# Patient Record
Sex: Female | Born: 1940 | Race: White | Hispanic: No | Smoking: Former smoker
Health system: Southern US, Community
[De-identification: ages and names within clinical notes are randomized; demographics above are authoritative.]

## PROBLEM LIST (undated history)

## (undated) DIAGNOSIS — Z8719 Personal history of other diseases of the digestive system: Secondary | ICD-10-CM

## (undated) DIAGNOSIS — I10 Essential (primary) hypertension: Secondary | ICD-10-CM

## (undated) DIAGNOSIS — D649 Anemia, unspecified: Secondary | ICD-10-CM

## (undated) DIAGNOSIS — Z8711 Personal history of peptic ulcer disease: Secondary | ICD-10-CM

## (undated) DIAGNOSIS — M1990 Unspecified osteoarthritis, unspecified site: Secondary | ICD-10-CM

## (undated) DIAGNOSIS — Z87442 Personal history of urinary calculi: Secondary | ICD-10-CM

## (undated) DIAGNOSIS — R011 Cardiac murmur, unspecified: Secondary | ICD-10-CM

## (undated) DIAGNOSIS — E079 Disorder of thyroid, unspecified: Secondary | ICD-10-CM

## (undated) HISTORY — DX: Personal history of other diseases of the digestive system: Z87.19

## (undated) HISTORY — DX: Personal history of peptic ulcer disease: Z87.11

## (undated) HISTORY — DX: Essential (primary) hypertension: I10

## (undated) HISTORY — PX: THYROIDECTOMY: SHX17

## (undated) HISTORY — DX: Cardiac murmur, unspecified: R01.1

## (undated) HISTORY — PX: ABDOMINAL HYSTERECTOMY: SHX81

---

## 2008-03-12 ENCOUNTER — Ambulatory Visit: Payer: Self-pay | Admitting: Nurse Practitioner

## 2008-12-26 ENCOUNTER — Ambulatory Visit: Payer: Self-pay | Admitting: Family Medicine

## 2009-01-01 ENCOUNTER — Encounter: Payer: Self-pay | Admitting: Nurse Practitioner

## 2009-01-23 ENCOUNTER — Emergency Department: Payer: Self-pay | Admitting: Unknown Physician Specialty

## 2009-01-25 ENCOUNTER — Inpatient Hospital Stay: Payer: Self-pay | Admitting: Internal Medicine

## 2009-01-30 ENCOUNTER — Encounter: Payer: Self-pay | Admitting: Nurse Practitioner

## 2009-02-19 ENCOUNTER — Ambulatory Visit: Payer: Self-pay | Admitting: Pain Medicine

## 2009-03-06 ENCOUNTER — Ambulatory Visit: Payer: Self-pay | Admitting: Physician Assistant

## 2009-03-19 ENCOUNTER — Ambulatory Visit: Payer: Self-pay | Admitting: Pain Medicine

## 2009-04-02 ENCOUNTER — Ambulatory Visit: Payer: Self-pay | Admitting: Pain Medicine

## 2009-04-11 ENCOUNTER — Ambulatory Visit: Payer: Self-pay | Admitting: Nurse Practitioner

## 2009-04-23 ENCOUNTER — Ambulatory Visit: Payer: Self-pay | Admitting: Physician Assistant

## 2009-05-13 ENCOUNTER — Ambulatory Visit: Payer: Self-pay | Admitting: Gastroenterology

## 2009-06-01 HISTORY — PX: PARATHYROIDECTOMY: SHX19

## 2009-07-11 ENCOUNTER — Ambulatory Visit: Payer: Self-pay | Admitting: Pain Medicine

## 2009-08-01 ENCOUNTER — Ambulatory Visit: Payer: Self-pay | Admitting: Pain Medicine

## 2010-06-17 ENCOUNTER — Ambulatory Visit: Payer: Self-pay | Admitting: Family Medicine

## 2011-07-01 ENCOUNTER — Ambulatory Visit: Payer: Self-pay

## 2012-07-01 ENCOUNTER — Ambulatory Visit: Payer: Self-pay | Admitting: Family Medicine

## 2012-07-11 ENCOUNTER — Ambulatory Visit: Payer: Self-pay | Admitting: Family Medicine

## 2012-07-25 ENCOUNTER — Ambulatory Visit: Payer: Self-pay | Admitting: Surgery

## 2013-07-06 ENCOUNTER — Ambulatory Visit: Payer: Self-pay | Admitting: Family Medicine

## 2014-01-09 DIAGNOSIS — D649 Anemia, unspecified: Secondary | ICD-10-CM | POA: Insufficient documentation

## 2014-01-09 DIAGNOSIS — I10 Essential (primary) hypertension: Secondary | ICD-10-CM | POA: Insufficient documentation

## 2014-01-09 DIAGNOSIS — E210 Primary hyperparathyroidism: Secondary | ICD-10-CM | POA: Insufficient documentation

## 2014-07-10 ENCOUNTER — Ambulatory Visit: Payer: Self-pay | Admitting: Family Medicine

## 2015-08-08 ENCOUNTER — Other Ambulatory Visit: Payer: Self-pay | Admitting: Family Medicine

## 2015-08-08 DIAGNOSIS — M542 Cervicalgia: Secondary | ICD-10-CM

## 2015-08-15 ENCOUNTER — Ambulatory Visit
Admission: RE | Admit: 2015-08-15 | Discharge: 2015-08-15 | Disposition: A | Discharge status: Home / Self Care | Payer: Medicare Other | Source: Ambulatory Visit | Attending: Family Medicine | Admitting: Family Medicine

## 2015-08-15 DIAGNOSIS — M542 Cervicalgia: Secondary | ICD-10-CM | POA: Insufficient documentation

## 2015-08-15 DIAGNOSIS — M2578 Osteophyte, vertebrae: Secondary | ICD-10-CM | POA: Insufficient documentation

## 2017-06-08 DIAGNOSIS — M5440 Lumbago with sciatica, unspecified side: Secondary | ICD-10-CM | POA: Insufficient documentation

## 2017-06-08 DIAGNOSIS — M25551 Pain in right hip: Secondary | ICD-10-CM | POA: Insufficient documentation

## 2017-06-09 ENCOUNTER — Other Ambulatory Visit: Payer: Self-pay | Admitting: Unknown Physician Specialty

## 2017-06-09 DIAGNOSIS — M25551 Pain in right hip: Secondary | ICD-10-CM

## 2017-06-22 ENCOUNTER — Ambulatory Visit
Admission: RE | Admit: 2017-06-22 | Discharge: 2017-06-22 | Disposition: A | Discharge status: Home / Self Care | Payer: Medicare Other | Source: Ambulatory Visit | Attending: Unknown Physician Specialty | Admitting: Unknown Physician Specialty

## 2017-06-22 DIAGNOSIS — M1611 Unilateral primary osteoarthritis, right hip: Secondary | ICD-10-CM | POA: Insufficient documentation

## 2017-06-22 DIAGNOSIS — M25551 Pain in right hip: Secondary | ICD-10-CM

## 2017-08-19 DIAGNOSIS — M1611 Unilateral primary osteoarthritis, right hip: Secondary | ICD-10-CM | POA: Insufficient documentation

## 2018-03-19 ENCOUNTER — Ambulatory Visit
Admission: EM | Admit: 2018-03-19 | Discharge: 2018-03-19 | Disposition: A | Discharge status: Home / Self Care | Payer: Medicare Other | Attending: Emergency Medicine | Admitting: Emergency Medicine

## 2018-03-19 ENCOUNTER — Other Ambulatory Visit: Payer: Self-pay

## 2018-03-19 ENCOUNTER — Encounter: Payer: Self-pay | Admitting: Gynecology

## 2018-03-19 DIAGNOSIS — Z87891 Personal history of nicotine dependence: Secondary | ICD-10-CM | POA: Insufficient documentation

## 2018-03-19 DIAGNOSIS — Z79899 Other long term (current) drug therapy: Secondary | ICD-10-CM | POA: Diagnosis not present

## 2018-03-19 DIAGNOSIS — N3000 Acute cystitis without hematuria: Secondary | ICD-10-CM | POA: Insufficient documentation

## 2018-03-19 DIAGNOSIS — M1990 Unspecified osteoarthritis, unspecified site: Secondary | ICD-10-CM | POA: Insufficient documentation

## 2018-03-19 DIAGNOSIS — E079 Disorder of thyroid, unspecified: Secondary | ICD-10-CM | POA: Diagnosis not present

## 2018-03-19 DIAGNOSIS — R300 Dysuria: Secondary | ICD-10-CM | POA: Diagnosis present

## 2018-03-19 HISTORY — DX: Anemia, unspecified: D64.9

## 2018-03-19 HISTORY — DX: Unspecified osteoarthritis, unspecified site: M19.90

## 2018-03-19 HISTORY — DX: Disorder of thyroid, unspecified: E07.9

## 2018-03-19 LAB — URINALYSIS, COMPLETE (UACMP) WITH MICROSCOPIC
Bilirubin Urine: NEGATIVE
Glucose, UA: NEGATIVE mg/dL
Ketones, ur: NEGATIVE mg/dL
Nitrite: POSITIVE — AB
Protein, ur: NEGATIVE mg/dL
Specific Gravity, Urine: 1.01 (ref 1.005–1.030)
WBC, UA: >50 WBC/hpf (ref 0–5)
pH: 6 (ref 5.0–8.0)

## 2018-03-19 MED ORDER — SULFAMETHOXAZOLE-TRIMETHOPRIM 800-160 MG PO TABS: 1.0000 | ORAL_TABLET | Freq: Two times a day (BID) | ORAL | 0 refills | Status: DC

## 2018-03-19 NOTE — ED Triage Notes (Signed)
Per patient c/o burning and frequency urination x yesterday.

## 2018-03-19 NOTE — ED Provider Notes (Addendum)
[facility]    CSN: [id number] Arrival date & time: 03/19/18  1006     History   Chief Complaint No chief complaint on file.   HPI Vanessa Cameron is a 77 y.o. female.   HPI  77 year old female presents with urinary burning and frequency along with urgency that she started having yesterday.  She goes on to tell me that she has had 3 UTIs pretty much in succession.  Her last one she just recently finished her prescription for Cipro.  She been on the Cipro for 10 days.  States that she is probably a victim of her anatomy.  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  Nausea or vomiting.  She has had no fever or chills.  She denies any back pain.  Denies any vaginal bleeding or discharge.  She is allergic to penicillins which causes an anaphylactic type reaction.  States that she has decreased appetite during the episodes of the UTI.        Past Medical History:  Diagnosis Date  . Anemia   . Arthritis   . Thyroid disease     There are no active problems to display for this patient.   Past Surgical History:  Procedure Laterality Date  . ABDOMINAL HYSTERECTOMY    . THYROIDECTOMY      OB History   None      Home Medications    Prior to Admission medications   Medication Sig Start Date End Date Taking? Authorizing Provider  celecoxib (CELEBREX) 200 MG capsule Take by mouth. 08/26/17  Yes [provider]  ferrous sulfate 325 (65 FE) MG tablet Take by mouth.   Yes [provider]  lisinopril (PRINIVIL,ZESTRIL) 10 MG tablet TAKE 1 TABLET BY MOUTH ONCE A DAY 11/12/17  Yes [provider]  traMADol (ULTRAM) 50 MG tablet 1/2-1 po tid prn 11/22/17  Yes [provider]  sulfamethoxazole-trimethoprim (BACTRIM DS,SEPTRA DS) 800-160 MG tablet Take 1 tablet by mouth 2 (two) times daily. 03/19/18   Lutricia Feil, PA-C    Family History History reviewed. No pertinent family history.  Social History Social History    Tobacco Use  . Smoking status: Former Games developer  . Smokeless tobacco: Never Used  . Tobacco comment: quit 30 yrs ago  Substance Use Topics  . Alcohol use: Yes    Alcohol/week: 1.0 standard drinks    Types: 1 Glasses of wine per week  . Drug use: Never     Allergies   Nsaids and Penicillins   Review of Systems Review of Systems  Constitutional: Positive for activity change. Negative for chills, fatigue and fever.  Genitourinary: Positive for dysuria, frequency and urgency. Negative for vaginal bleeding, vaginal discharge and vaginal pain.  All other systems reviewed and are negative.    Physical Exam Triage Vital Signs ED Triage Vitals  Enc Vitals Group     BP 03/19/18 1013 (!) 167/90     Pulse Rate 03/19/18 1013 79     Resp 03/19/18 1013 16     Temp 03/19/18 1013 98.2 F (36.8 C)     Temp Source 03/19/18 1013 Oral     SpO2 03/19/18 1013 100 %     Weight 03/19/18 1013 132 lb (59.9 kg)     Height 03/19/18 1013 5\' 4"  (1.626 m)     Head Circumference --      Peak Flow --      Pain Score 03/19/18 1012 0  Pain Loc --      Pain Edu? --      Excl. in GC? --    No data found.  Updated Vital Signs BP (!) 167/90 (BP Location: Left Arm)   Pulse 79   Temp 98.2 F (36.8 C) (Oral)   Resp 16   Ht 5\' 4"  (1.626 m)   Wt 132 lb (59.9 kg)   SpO2 100%   BMI 22.66 kg/m   Visual Acuity Right Eye Distance:   Left Eye Distance:   Bilateral Distance:    Right Eye Near:   Left Eye Near:    Bilateral Near:     Physical Exam  Constitutional: She is oriented to person, place, and time. She appears well-developed and well-nourished. No distress.  HENT:  Head: Normocephalic.  Eyes: Pupils are equal, round, and reactive to light. Right eye exhibits no discharge. Left eye exhibits no discharge.  Neck: Normal range of motion.  Abdominal:  No CVA tenderness  Musculoskeletal: Normal range of motion.  Neurological: She is alert and oriented to person, place, and time.  Skin:  Skin is warm and dry.  Psychiatric: She has a normal mood and affect. Her behavior is normal. Judgment and thought content normal.  Nursing note and vitals reviewed.    UC Treatments / Results  Labs (all labs ordered are listed, but only abnormal results are displayed) Labs Reviewed  URINALYSIS, COMPLETE (UACMP) WITH MICROSCOPIC - Abnormal; Notable for the following components:      Result Value   APPearance CLOUDY (*)    Hgb urine dipstick SMALL (*)    Nitrite POSITIVE (*)    Leukocytes, UA MODERATE (*)    Bacteria, UA FEW (*)    All other components within normal limits  URINE CULTURE    EKG None  Radiology No results found.  Procedures Procedures (including critical care time)  Medications Ordered in UC Medications - No data to display  Initial Impression / Assessment and Plan / UC Course  I have reviewed the triage vital signs and the nursing notes.  Pertinent labs & imaging results that were available during my care of the patient were reviewed by me and considered in my medical decision making (see chart for details).    Since the patient just came off  Cipro, and the because of the black box warnings regarding the medication, I will treat her today with Septra.  Review of her previous cultures and sensitivities from the last UTI, grew Enterobacter cloaca of which she had sensitivities to almost all of the medications except Rocephin ,ceftezole and amoxicillin.  She is agreeable to this.  We will culture the urine today and I have highly recommended that she follow-up with a urologist because of the recurrence is that she has had recently     Final Clinical Impressions(s) / UC Diagnoses   Final diagnoses:  Acute cystitis without hematuria     Discharge Instructions     Recommend following up with a urologist for further evaluation due to your recurrent urinary tract infections.  Is a pleasure taking care of you today.  Good luck    ED Prescriptions     Medication Sig Dispense Auth. Provider   sulfamethoxazole-trimethoprim (BACTRIM DS,SEPTRA DS) 800-160 MG tablet Take 1 tablet by mouth 2 (two) times daily. 10 tablet Lutricia Feil, PA-C     Controlled Substance Prescriptions [place] Controlled Substance Registry consulted? Not Applicable    Lutricia Feil, PA-C 03/19/18 1207

## 2018-03-19 NOTE — Discharge Instructions (Signed)
Recommend following up with a urologist for further evaluation due to your recurrent urinary tract infections.  Is a pleasure taking care of you today.  Good luck

## 2018-03-22 ENCOUNTER — Telehealth: Payer: Self-pay | Admitting: Emergency Medicine

## 2018-03-22 LAB — URINE CULTURE: Culture: >=100000 — AB

## 2018-03-22 MED ORDER — CIPROFLOXACIN HCL 500 MG PO TABS: 500.0000 mg | ORAL_TABLET | Freq: Two times a day (BID) | ORAL | 0 refills | Status: AC

## 2018-03-22 NOTE — Telephone Encounter (Signed)
Based on results, provider changed recommendation to Cipro 500mg  bid for 5 days.  Contacted patient, results communicated, patient verbalized understanding and states she has an appointment with Urology in November.nmw

## 2018-04-04 ENCOUNTER — Other Ambulatory Visit
Admission: RE | Admit: 2018-04-04 | Discharge: 2018-04-04 | Disposition: A | Discharge status: Home / Self Care | Payer: Medicare Other | Source: Ambulatory Visit | Attending: Urology | Admitting: Urology

## 2018-04-04 ENCOUNTER — Other Ambulatory Visit: Payer: Self-pay

## 2018-04-04 ENCOUNTER — Ambulatory Visit (INDEPENDENT_AMBULATORY_CARE_PROVIDER_SITE_OTHER): Payer: Medicare Other | Admitting: Urology

## 2018-04-04 ENCOUNTER — Encounter: Payer: Self-pay | Admitting: Urology

## 2018-04-04 VITALS — BP 161/79 | Ht 64.0 in | Wt 131.0 lb

## 2018-04-04 DIAGNOSIS — N390 Urinary tract infection, site not specified: Secondary | ICD-10-CM

## 2018-04-04 DIAGNOSIS — N952 Postmenopausal atrophic vaginitis: Secondary | ICD-10-CM | POA: Diagnosis not present

## 2018-04-04 LAB — URINALYSIS, COMPLETE (UACMP) WITH MICROSCOPIC
BACTERIA UA: NONE SEEN
BILIRUBIN URINE: NEGATIVE
Glucose, UA: NEGATIVE mg/dL
Hgb urine dipstick: NEGATIVE
KETONES UR: NEGATIVE mg/dL
LEUKOCYTES UA: NEGATIVE
NITRITE: NEGATIVE
PH: 7 (ref 5.0–8.0)
PROTEIN: NEGATIVE mg/dL
RBC / HPF: NONE SEEN RBC/hpf (ref 0–5)
SQUAMOUS EPITHELIAL / LPF: NONE SEEN (ref 0–5)
Specific Gravity, Urine: 1.01 (ref 1.005–1.030)

## 2018-04-04 LAB — BLADDER SCAN AMB NON-IMAGING: Scan Result: 0

## 2018-04-04 MED ORDER — ESTRADIOL 0.1 MG/GM VA CREA: TOPICAL_CREAM | VAGINAL | 12 refills | Status: DC

## 2018-04-04 MED ORDER — ESTROGENS, CONJUGATED 0.625 MG/GM VA CREA: TOPICAL_CREAM | VAGINAL | 12 refills | Status: DC

## 2018-04-04 NOTE — Progress Notes (Signed)
04/04/2018 11:43 AM   Vanessa Cameron February 13, 1941 [id number]  Referring provider: Sofie Hartigan, MD [place], [place] [address]  Chief Complaint  Patient presents with  . Urinary Tract Infection    HPI: Patient is a 77 -year-old Caucasian female who is referred to [place] by [facility] for recurrent urinary tract infections.  Patient states that she has had four urinary tract infections over the last year.  Reviewing her records,  she has had four documented positive urine culture.   Positive for E. Coli on September 03, 2017 Positive for Enterococcus faecalis on January 14, 2018 Positive for Enterobacter cloacae resistant to Augmentin, cefazolin and cefuroxime on February 18, 2018 Positive for Enterococcus faecalis on March 23, 2018  Her symptoms with a urinary tract infection consist of dysuria, HA, urgency and frequency.    She had a stone 13 years ago due to hyperparathyroidism and she passed it spontaneously.  She does not have a history of GU surgery or GU trauma.   She is not sexually active.  She is postmenopausal.   She denies constipation and/or diarrhea.   She does engage in good perineal hygiene. She does not take tub baths.   She does not have incontinence.    She is drinking a lot of water daily.  She does not drink soda.  She drink an occasional weak tea or coffee.  She does not drink alcohol.  No energy drinks.    Today she is having her baseline nocturia.  Patient denies any gross hematuria, dysuria or suprapubic/flank pain.  Patient denies any fevers, chills, nausea or vomiting.   Her UA is bland.  Her PVR is 0 mL.     PMH: Past Medical History:  Diagnosis Date  . Anemia   . Arthritis   . Arthritis   . Heart murmur   . History of stomach ulcers   . Hypertension   . Thyroid disease     Surgical History: Past Surgical History:  Procedure Laterality Date  . ABDOMINAL HYSTERECTOMY    . ABDOMINAL HYSTERECTOMY    .  PARATHYROIDECTOMY    . THYROIDECTOMY      Home Medications:  Allergies as of 04/04/2018      Reactions   Nsaids Other (See Comments)   Penicillins Other (See Comments)   TONGUE SWELLING.      Medication List        Accurate as of 04/04/18 11:43 AM. Always use your most recent med list.          celecoxib 200 MG capsule Commonly known as:  CELEBREX Take by mouth.   conjugated estrogens vaginal cream Commonly known as:  PREMARIN Use pea size amount on tip of finger around urethra before bed Monday Wednesday and Friday   estradiol 0.1 MG/GM vaginal cream Commonly known as:  ESTRACE Use pea size amount on tip of finger around urethra before bed Monday Wednesday and Friday   ferrous sulfate 325 (65 FE) MG tablet Take by mouth.   lisinopril 10 MG tablet Commonly known as:  PRINIVIL,ZESTRIL TAKE 1 TABLET BY MOUTH ONCE A DAY   sulfamethoxazole-trimethoprim 800-160 MG tablet Commonly known as:  BACTRIM DS,SEPTRA DS Take 1 tablet by mouth 2 (two) times daily.   traMADol 50 MG tablet Commonly known as:  ULTRAM 1/2-1 po tid prn   Vitamin D3-Vitamin C 1000-500 UNIT-MG Caps Take 500 Units by mouth daily.       Allergies:  Allergies  Allergen Reactions  . Nsaids Other (See Comments)  . Penicillins Other (See Comments)    TONGUE SWELLING.    Family History: No family history on file.  Social History:  reports that she has quit smoking. She has never used smokeless tobacco. She reports that she drinks about 1.0 standard drinks of alcohol per week. She reports that she does not use drugs.  ROS: UROLOGY Frequent Urination?: No Hard to postpone urination?: No Burning/pain with urination?: No Get up at night to urinate?: Yes Leakage of urine?: No Urine stream starts and stops?: No Trouble starting stream?: No Do you have to strain to urinate?: No Blood in urine?: No Urinary tract infection?: Yes Sexually transmitted disease?: No Injury to kidneys or bladder?:  No Painful intercourse?: No Weak stream?: No Currently pregnant?: No Vaginal bleeding?: No Last menstrual period?: n  Gastrointestinal Nausea?: No Vomiting?: No Indigestion/heartburn?: No Diarrhea?: No Constipation?: No  Constitutional Fever: No Weight loss?: No Fatigue?: No  Skin Skin rash/lesions?: No Itching?: No  Eyes Blurred vision?: No Double vision?: No  Ears/Nose/Throat Sore throat?: No Sinus problems?: No  Hematologic/Lymphatic Swollen glands?: No Easy bruising?: No  Cardiovascular Leg swelling?: No Chest pain?: No  Respiratory Cough?: No Shortness of breath?: No  Endocrine Excessive thirst?: No  Musculoskeletal Back pain?: No Joint pain?: Yes  Neurological Headaches?: No Dizziness?: No  Psychologic Depression?: No Anxiety?: No  Physical Exam: BP (!) 161/79   Ht '5\' 4"'$  (1.626 m)   Wt 131 lb (59.4 kg)   BMI 22.49 kg/m   Constitutional:  Well nourished. Alert and oriented, No acute distress. HEENT: [place] AT, moist mucus membranes.  Trachea midline, no masses. Cardiovascular: No clubbing, cyanosis, or edema. Respiratory: Normal respiratory effort, no increased work of breathing. GI: Abdomen is soft, non tender, non distended, no abdominal masses. Liver and spleen not palpable.  No hernias appreciated.  Stool sample for occult testing is not indicated.   GU: No CVA tenderness.  No bladder fullness or masses.  Atrophic external genitalia, normal pubic hair distribution, no lesions.  Normal urethral meatus, no lesions, no prolapse, no discharge.   Urethral caruncle is noted.   No bladder fullness, tenderness or masses. Pale vagina mucosa, poor estrogen effect, no discharge, no lesions, poor pelvic support, Grade II cystocele noted and no rectocele noted.  Cervix and uterus are surgically absent.  Adnexa are surgically absent.  Anus and perineum are without rashes or lesions.    Skin: No rashes, bruises or suspicious lesions. Lymph: No cervical or  inguinal adenopathy. Neurologic: Grossly intact, no focal deficits, moving all 4 extremities. Psychiatric: Normal mood and affect.  Laboratory Data: No results found for: WBC, HGB, HCT, MCV, PLT  No results found for: CREATININE  No results found for: PSA  No results found for: TESTOSTERONE  No results found for: HGBA1C  No results found for: TSH  No results found for: CHOL, HDL, CHOLHDL, VLDL, LDLCALC  No results found for: AST No results found for: ALT No components found for: ALKALINEPHOPHATASE No components found for: BILIRUBINTOTAL  No results found for: ESTRADIOL  Urinalysis Bland.  See Epic.  I have reviewed the labs.   Pertinent Imaging: Results for AMELDA, HAPKE HUNT (MRN [id number]) as of 04/04/2018 11:30  Ref. Range 04/04/2018 11:21  Scan Result Unknown 0    Assessment & Plan:    1. Recurrent UTI Criteria for recurrent UTI has been met with 2 or more infections in 6 months or 3 or greater infections in one  year  Patient with good fluid intake We will obtain renal ultrasound to rule out nidus for infection  2. Vaginal atrophy I explained to the patient that when women go through menopause and her estrogen levels are severely diminished, the normal vaginal flora will change.  This is due to an increase of the vaginal canal's pH. Because of this, the vaginal canal may be colonized by bacteria from the rectum instead of the protective lactobacillus.  This, accompanied by the loss of the mucus barrier with vaginal atrophy, is a cause of recurrent urinary tract infections. In some studies, the use of vaginal estrogen cream has been demonstrated to reduce  recurrent urinary tract infections to one a year.  Patient was given a sample of vaginal estrogen cream (Premarin vaginal cream) and instructed to apply 0.'5mg'$  (pea-sized amount)  just inside the vaginal introitus with a finger-tip on Monday, Wednesday and Friday nights.  I explained to the patient that vaginally  administered estrogen, which causes only a slight increase in the blood estrogen levels, have fewer contraindications and adverse systemic effects that oral HT. I have also given prescriptions for the Estrace cream and Premarin cream, so that the patient may carry them to the pharmacy to see which one of the branded creams would be most economical for her.  If she finds both medications cost prohibitive, she is instructed to call the office.  We can then call in a compounded vaginal estrogen cream for the patient that may be more affordable.   She will follow up in one month for an exam.                                    Return in about 1 month (around 05/04/2018) for recheck and RUS report results .  These notes generated with voice recognition software. I apologize for typographical errors.  Zara Council, PA-C  [facility] [address]  [place], [place] [address] [phone]

## 2018-04-04 NOTE — Patient Instructions (Signed)
I have given you two prescriptions for a vaginal estrogen cream.  Estrace and Premarin.  Please take these to your pharmacy and see which one your insurance covers.  If both are too expensive, please call the office at [phone] for an alternative.  You are given a sample of vaginal estrogen cream Premarin and instructed to apply 0.5mg (pea-sized amount)  just inside the vaginal introitus with a finger-tip on Monday, Wednesday and Friday nights,     

## 2018-05-03 ENCOUNTER — Encounter (INDEPENDENT_AMBULATORY_CARE_PROVIDER_SITE_OTHER): Payer: Self-pay

## 2018-05-03 ENCOUNTER — Ambulatory Visit
Admission: RE | Admit: 2018-05-03 | Discharge: 2018-05-03 | Disposition: A | Discharge status: Home / Self Care | Payer: Medicare Other | Source: Ambulatory Visit | Attending: Urology | Admitting: Urology

## 2018-05-03 DIAGNOSIS — N390 Urinary tract infection, site not specified: Secondary | ICD-10-CM | POA: Diagnosis present

## 2018-05-03 DIAGNOSIS — N281 Cyst of kidney, acquired: Secondary | ICD-10-CM | POA: Insufficient documentation

## 2018-05-09 ENCOUNTER — Telehealth: Payer: Self-pay | Admitting: Urology

## 2018-05-09 ENCOUNTER — Encounter: Payer: Self-pay | Admitting: Urology

## 2018-05-09 ENCOUNTER — Other Ambulatory Visit: Payer: Self-pay

## 2018-05-09 ENCOUNTER — Ambulatory Visit (INDEPENDENT_AMBULATORY_CARE_PROVIDER_SITE_OTHER): Payer: Medicare Other | Admitting: Urology

## 2018-05-09 VITALS — BP 140/72 | HR 90 | Wt 128.0 lb

## 2018-05-09 DIAGNOSIS — N390 Urinary tract infection, site not specified: Secondary | ICD-10-CM | POA: Diagnosis not present

## 2018-05-09 DIAGNOSIS — N952 Postmenopausal atrophic vaginitis: Secondary | ICD-10-CM | POA: Diagnosis not present

## 2018-05-09 NOTE — Progress Notes (Signed)
05/09/2018 9:30 AM   Vanessa Cameron 1940/10/11 [id number]  Referring provider: Sofie Hartigan, MD [place], [place] [address]  Chief Complaint  Patient presents with  . Recurrent UTI    HPI: Patient is a 77 -year-old Caucasian female with rUTI's and vaginal atrophy who presents today for RUS report and symptoms recheck.  Background history Referred to [place] by [facility] for recurrent urinary tract infections.  Patient states that she has had four urinary tract infections over the last year.  Reviewing her records,  she has had four documented positive urine culture.   Positive for E. Coli on September 03, 2017 Positive for Enterococcus faecalis on January 14, 2018 Positive for Enterobacter cloacae resistant to Augmentin, cefazolin and cefuroxime on February 18, 2018 Positive for Enterococcus faecalis on March 23, 2018  Her symptoms with a urinary tract infection consist of dysuria, HA, urgency and frequency.  She had a stone 13 years ago due to hyperparathyroidism and she passed it spontaneously.  She does not have a history of GU surgery or GU trauma.  She is not sexually active.  She is postmenopausal.   She denies constipation and/or diarrhea.   She does engage in good perineal hygiene. She does not take tub baths.   She does not have incontinence.  She is drinking a lot of water daily.  She does not drink soda.  She drink an occasional weak tea or coffee.  She does not drink alcohol.  No energy drinks.   Baseline nocturia.  Her PVR was 0 mL.    RUS on 05/03/2018 revealed no hydronephrosis put. Normal renal cortical echotexture.  Subcentimeter simple cyst in the upper pole of the left kidney.  Normal appearing urinary bladder.  She is applying vaginal estrogen cream three times weekly.   She has not had any UTI's since starting the cream, but she has found the prescriptions cost prohibitive.    Patient denies any gross hematuria, dysuria or suprapubic/flank  pain.  Patient denies any fevers, chills, nausea or vomiting.     PMH: Past Medical History:  Diagnosis Date  . Anemia   . Arthritis   . Arthritis   . Heart murmur   . History of stomach ulcers   . Hypertension   . Thyroid disease     Surgical History: Past Surgical History:  Procedure Laterality Date  . ABDOMINAL HYSTERECTOMY    . ABDOMINAL HYSTERECTOMY    . PARATHYROIDECTOMY    . THYROIDECTOMY      Home Medications:  Allergies as of 05/09/2018      Reactions   Nsaids Other (See Comments)   Penicillins Other (See Comments)   TONGUE SWELLING.      Medication List        Accurate as of 05/09/18  9:30 AM. Always use your most recent med list.          celecoxib 200 MG capsule Commonly known as:  CELEBREX Take by mouth.   conjugated estrogens vaginal cream Commonly known as:  PREMARIN Use pea size amount on tip of finger around urethra before bed Monday Wednesday and Friday   conjugated estrogens vaginal cream Commonly known as:  PREMARIN Apply 0.'5mg'$  (pea-sized amount)  just inside the vaginal introitus with a finger-tip on  Monday, Wednesday and Friday nights.   estradiol 0.1 MG/GM vaginal cream Commonly known as:  ESTRACE Use pea size amount on tip of finger around urethra before bed Monday Wednesday and Friday  estradiol 0.1 MG/GM vaginal cream Commonly known as:  ESTRACE Apply 0.'5mg'$  (pea-sized amount)  just inside the vaginal introitus with a finger-tip on Monday, Wednesday and Friday nights.   ferrous sulfate 325 (65 FE) MG tablet Take by mouth.   lisinopril 10 MG tablet Commonly known as:  PRINIVIL,ZESTRIL TAKE 1 TABLET BY MOUTH ONCE A DAY   traMADol 50 MG tablet Commonly known as:  ULTRAM 1/2-1 po tid prn   Vitamin D3-Vitamin C 1000-500 UNIT-MG Caps Take 500 Units by mouth daily.       Allergies:  Allergies  Allergen Reactions  . Nsaids Other (See Comments)  . Penicillins Other (See Comments)    TONGUE SWELLING.    Family  History: No family history on file.  Social History:  reports that she has quit smoking. She has never used smokeless tobacco. She reports that she drinks about 1.0 standard drinks of alcohol per week. She reports that she does not use drugs.  ROS: UROLOGY Frequent Urination?: No Hard to postpone urination?: No Burning/pain with urination?: No Get up at night to urinate?: No Leakage of urine?: No Urine stream starts and stops?: No Trouble starting stream?: No Do you have to strain to urinate?: No Blood in urine?: No Urinary tract infection?: No Sexually transmitted disease?: No Injury to kidneys or bladder?: No Painful intercourse?: No Weak stream?: No Currently pregnant?: No Vaginal bleeding?: No Last menstrual period?: n  Gastrointestinal Nausea?: No Vomiting?: No Indigestion/heartburn?: No Diarrhea?: No Constipation?: No  Constitutional Fever: No Night sweats?: No Weight loss?: No Fatigue?: No  Skin Skin rash/lesions?: No Itching?: No  Eyes Blurred vision?: No Double vision?: No  Ears/Nose/Throat Sore throat?: No Sinus problems?: No  Hematologic/Lymphatic Swollen glands?: No Easy bruising?: No  Cardiovascular Leg swelling?: No Chest pain?: No  Respiratory Cough?: No Shortness of breath?: No  Endocrine Excessive thirst?: No  Musculoskeletal Back pain?: No Joint pain?: No  Neurological Headaches?: No Dizziness?: No  Psychologic Depression?: No Anxiety?: No  Physical Exam: BP 140/72   Pulse 90   Wt 128 lb (58.1 kg)   BMI 21.97 kg/m   Constitutional: Well nourished. Alert and oriented, No acute distress. HEENT: [place] AT, moist mucus membranes. Trachea midline, no masses. Cardiovascular: No clubbing, cyanosis, or edema. Respiratory: Normal respiratory effort, no increased work of breathing. Skin: No rashes, bruises or suspicious lesions. Neurologic: Grossly intact, no focal deficits, moving all 4 extremities. Psychiatric: Normal  mood and affect.  Laboratory Data: No results found for: WBC, HGB, HCT, MCV, PLT  No results found for: CREATININE  No results found for: PSA  No results found for: TESTOSTERONE  No results found for: HGBA1C  No results found for: TSH  No results found for: CHOL, HDL, CHOLHDL, VLDL, LDLCALC  No results found for: AST No results found for: ALT No components found for: ALKALINEPHOPHATASE No components found for: BILIRUBINTOTAL  No results found for: ESTRADIOL  I have reviewed the labs.   Pertinent Imaging: CLINICAL DATA:  recurrent UTI over the past 3 months  EXAM: RENAL / URINARY TRACT ULTRASOUND COMPLETE  COMPARISON:  9.2 x 3.5 x 3.9 cm  FINDINGS: Right Kidney:  Renal measurements: 9.2 x 3.5 x 3.9 cm = volume: 65 mL. Normal renal cortical echotexture. No hydronephrosis.  Left Kidney:  Renal measurements: 11.1 x 3.8 x 4.3 cm corresponding to a volume of 16m.: The renal cortical echotexture is similar to that on the right. There is an upper pole simple appearing cortical cyst measuring 8 mm  in greatest dimension. There is no hydronephrosis.  Bladder:  Appears normal for degree of bladder distention.  IMPRESSION: No hydronephrosis put. Normal renal cortical echotexture. Subcentimeter simple cyst in the upper pole of the left kidney. Normal appearing urinary bladder.   Electronically Signed   By: David  Martinique M.D.   On: 05/03/2018 14:03 I have independently reviewed the films and no nidus seen for rUTI's.    Assessment & Plan:    1. Recurrent UTI Criteria for recurrent UTI has been met with 2 or more infections in 6 months or 3 or greater infections in one year  Patient with good fluid intake Reviewed Renal ultrasound results with the patient and that it did not demonstrate a nidus for infection  2. Vaginal atrophy Patient will continue applying the vaginal estrogen cream 3 nights weekly I have given her a sample of the Premarin cream  to help offset the cost and we will call in the compounding estrogen for her                                Return in about 3 months (around 08/08/2018) for exam .  These notes generated with voice recognition software. I apologize for typographical errors.  Zara Council, PA-C  [facility] [address]  [place], [place] [address] [phone]

## 2018-05-09 NOTE — Telephone Encounter (Signed)
Would you call in the compounded estrogen cream for this patient?  The Premarin cream and Estrace cream were cost prohibitive.

## 2018-05-10 MED ORDER — NONFORMULARY OR COMPOUNDED ITEM
3 refills | Status: DC
Start: 2018-05-10 — End: 2019-06-18

## 2018-05-10 NOTE — Telephone Encounter (Signed)
Rx sent into [facility], attempted to notify patient however no answer.

## 2018-06-15 ENCOUNTER — Other Ambulatory Visit: Payer: Self-pay

## 2018-06-15 ENCOUNTER — Ambulatory Visit
Admission: RE | Admit: 2018-06-15 | Discharge: 2018-06-15 | Disposition: A | Discharge status: Home / Self Care | Payer: Medicare Other | Source: Ambulatory Visit | Attending: Surgery | Admitting: Surgery

## 2018-06-15 ENCOUNTER — Encounter
Admission: RE | Admit: 2018-06-15 | Discharge: 2018-06-15 | Disposition: A | Discharge status: Home / Self Care | Payer: Medicare Other | Source: Ambulatory Visit | Attending: Surgery | Admitting: Surgery

## 2018-06-15 DIAGNOSIS — Z01810 Encounter for preprocedural cardiovascular examination: Secondary | ICD-10-CM

## 2018-06-15 HISTORY — DX: Personal history of urinary calculi: Z87.442

## 2018-06-15 LAB — SURGICAL PCR SCREEN
MRSA, PCR: NEGATIVE
STAPHYLOCOCCUS AUREUS: NEGATIVE

## 2018-06-15 LAB — BASIC METABOLIC PANEL
Anion gap: 8 (ref 5–15)
BUN: 12 mg/dL (ref 8–23)
CALCIUM: 9.2 mg/dL (ref 8.9–10.3)
CHLORIDE: 96 mmol/L — AB (ref 98–111)
CO2: 26 mmol/L (ref 22–32)
CREATININE: 0.55 mg/dL (ref 0.44–1.00)
GFR calc Af Amer: >60 mL/min (ref >60)
GFR calc non Af Amer: >60 mL/min (ref >60)
GLUCOSE: 93 mg/dL (ref 70–99)
Potassium: 3.9 mmol/L (ref 3.5–5.1)
Sodium: 130 mmol/L — ABNORMAL LOW (ref 135–145)

## 2018-06-15 LAB — URINALYSIS, ROUTINE W REFLEX MICROSCOPIC
BILIRUBIN URINE: NEGATIVE
GLUCOSE, UA: NEGATIVE mg/dL
HGB URINE DIPSTICK: NEGATIVE
Ketones, ur: 5 mg/dL — AB
Leukocytes, UA: NEGATIVE
Nitrite: NEGATIVE
PH: 7 (ref 5.0–8.0)
Protein, ur: NEGATIVE mg/dL
SPECIFIC GRAVITY, URINE: 1.012 (ref 1.005–1.030)

## 2018-06-15 LAB — CBC WITH DIFFERENTIAL/PLATELET
Abs Immature Granulocytes: 0.01 10*3/uL (ref 0.00–0.07)
Basophils Absolute: 0.1 10*3/uL (ref 0.0–0.1)
Basophils Relative: 1 %
EOS ABS: 0.1 10*3/uL (ref 0.0–0.5)
Eosinophils Relative: 1 %
HEMATOCRIT: 38.5 % (ref 36.0–46.0)
HEMOGLOBIN: 13.2 g/dL (ref 12.0–15.0)
Immature Granulocytes: 0 %
LYMPHS ABS: 1 10*3/uL (ref 0.7–4.0)
LYMPHS PCT: 16 %
MCH: 33.4 pg (ref 26.0–34.0)
MCHC: 34.3 g/dL (ref 30.0–36.0)
MCV: 97.5 fL (ref 80.0–100.0)
Monocytes Absolute: 0.6 10*3/uL (ref 0.1–1.0)
Monocytes Relative: 10 %
NEUTROS PCT: 72 %
NRBC: 0 % (ref 0.0–0.2)
Neutro Abs: 4.4 10*3/uL (ref 1.7–7.7)
Platelets: 284 10*3/uL (ref 150–400)
RBC: 3.95 MIL/uL (ref 3.87–5.11)
RDW: 11.6 % (ref 11.5–15.5)
WBC: 6.2 10*3/uL (ref 4.0–10.5)

## 2018-06-15 NOTE — Patient Instructions (Signed)
Your procedure is scheduled on: Thursday, June 30, 2018  Report to THE SECOND FLOOR OF THE MEDICAL MALL   DO NOT STOP ON THE FIRST FLOOR TO REGISTER  To find out your arrival time please call [phone] between 1PM - 3PM on Wednesday, June 29, 2018*.  Remember: Instructions that are not followed completely may result in serious medical risk,  up to and including death, or upon the discretion of your surgeon and anesthesiologist your  surgery may need to be rescheduled.     _X__ 1. Do not eat food after midnight the night before your procedure.                 No gum chewing or hard candies.                     ABSOLUTELY NOTHING SOLID IN YOUR MOUTH AFTER MIDNIGHT                  You may drink clear liquids up to 2 hours before you are scheduled to arrive for your surgery-                   DO not drink clear liquids within 2 hours of the start of your surgery.                  Clear Liquids include:  water, apple juice without pulp, clear carbohydrate                 drink such as Clearfast of Gatorade, Black Coffee or Tea (Do not add                 anything to coffee or tea). SUGAR MAY BE ADDED BUT NO DAIRY PRODUCTS  __X__2.  On the morning of surgery brush your teeth with toothpaste and water, you                may rinse your mouth with mouthwash if you wish.  Do not swallow any toothpaste of mouthwash.     _X__ 3.  No Alcohol for 24 hours before or after surgery.   _X__ 4.  Do Not Smoke or use e-cigarettes For 24 Hours Prior to Your Surgery.                 Do not use any chewable tobacco products for at least 6 hours prior to                 surgery.  ____  5.  Bring all medications with you on the day of surgery if instructed.   _X___  6.  Notify your doctor if there is any change in your medical condition      (cold, fever, infections).     Do not wear jewelry, make-up, hairpins, clips or nail polish. Do not wear lotions, powders, or  perfumes. You may wear deodorant. Do not shave 48 hours prior to surgery. Men may shave face and neck. Do not bring valuables to the hospital.    [facility] is not responsible for any belongings or valuables.  Contacts, dentures or bridgework may not be worn into surgery. Leave your suitcase in the car. After surgery it may be brought to your room. For patients admitted to the hospital, discharge time is determined by your treatment team.   Patients discharged the day of surgery will not be allowed to drive home.   Please read over the  following fact sheets that you were given:   PREPARING FOR SURGERY               MRSA:STOP THE SPREAD                 ADVANCED DIRECTIVES   __X__ Take these medicines the morning of surgery with A SIP OF WATER:    1. PAIN MEDICATION   2.   3.   4.  5.  6.  ____ Fleet Enema (as directed)   __X__ Use CHG Soap as directed  _X___ Stop ASPIRIN PRODUCTS NOW  _X___ Stop Anti-inflammatories AS OF January 23RD   ____ Stop supplements until after surgery.    ____ Bring C-Pap to the hospital.   CONTINUE TAKING IRON BUT NOT ON THE DAY OF SURGERY  CONTINUE TAKING LISINOPRIL BUT NOT ON THE DAY OF SURGERY  CONTINUE USING ESTRADIOL CREAM  WEAR COMFORTABLE CLOTHES AND HAVE STURDY AND STEADY SHOES.  IF YOU COMPLETE THE MEDICAL DIRECTIVES, PLEASE BRING WITH YOU SO WE    MAY MAKE A COPY FOR YOUR CHART

## 2018-06-17 LAB — URINE CULTURE

## 2018-06-17 NOTE — Pre-Procedure Instructions (Signed)
  Secure chat sent to Dr. Joice Lofts regarding UC result. He advised to hold of on recollection unless she clinically complains of burning with urination, odor, etc.   Status:  Final result Visible to patient:  No (Not Released) Next appt:  08/08/2018 at 09:00 AM in Urology [facility], PA-C)  Specimen Information: Urine, Clean Catch     Component 2d ago  Specimen Description URINE, CLEAN CATCH  Performed at [facility], [address], [place], [place] [address]   Special Requests NONE  Performed at [facility], [address], [place], [place] [address]   Culture Multiple bacterial morphotypes present, none predominant. Suggest appropriate recollection if clinically indicated.  Report Status 06/17/2018 FINAL

## 2018-06-29 MED ORDER — CLINDAMYCIN PHOSPHATE 900 MG/50ML IV SOLN
900.0000 mg | Freq: Once | INTRAVENOUS | Status: AC
  Administered 2018-06-30: 900 mg via INTRAVENOUS

## 2018-06-30 ENCOUNTER — Encounter
Admission: RE | Disposition: A | Discharge status: Home Health | Payer: Self-pay | Source: Home / Self Care | Attending: Surgery

## 2018-06-30 ENCOUNTER — Encounter: Payer: Self-pay | Admitting: Certified Registered Nurse Anesthetist

## 2018-06-30 ENCOUNTER — Inpatient Hospital Stay: Payer: Medicare Other

## 2018-06-30 ENCOUNTER — Inpatient Hospital Stay: Payer: Medicare Other | Admitting: Certified Registered Nurse Anesthetist

## 2018-06-30 ENCOUNTER — Other Ambulatory Visit: Payer: Self-pay

## 2018-06-30 ENCOUNTER — Inpatient Hospital Stay
Admission: RE | Admit: 2018-06-30 | Discharge: 2018-07-03 | DRG: 470 | Disposition: A | Discharge status: Home Health | Payer: Medicare Other | Attending: Surgery | Admitting: Surgery

## 2018-06-30 DIAGNOSIS — Z8711 Personal history of peptic ulcer disease: Secondary | ICD-10-CM

## 2018-06-30 DIAGNOSIS — M1611 Unilateral primary osteoarthritis, right hip: Secondary | ICD-10-CM | POA: Diagnosis present

## 2018-06-30 DIAGNOSIS — M8580 Other specified disorders of bone density and structure, unspecified site: Secondary | ICD-10-CM | POA: Diagnosis present

## 2018-06-30 DIAGNOSIS — Z880 Allergy status to penicillin: Secondary | ICD-10-CM

## 2018-06-30 DIAGNOSIS — I959 Hypotension, unspecified: Secondary | ICD-10-CM | POA: Diagnosis not present

## 2018-06-30 DIAGNOSIS — Z886 Allergy status to analgesic agent status: Secondary | ICD-10-CM

## 2018-06-30 DIAGNOSIS — Z87442 Personal history of urinary calculi: Secondary | ICD-10-CM

## 2018-06-30 DIAGNOSIS — Z87891 Personal history of nicotine dependence: Secondary | ICD-10-CM | POA: Diagnosis not present

## 2018-06-30 DIAGNOSIS — R011 Cardiac murmur, unspecified: Secondary | ICD-10-CM | POA: Diagnosis present

## 2018-06-30 DIAGNOSIS — Z96641 Presence of right artificial hip joint: Secondary | ICD-10-CM

## 2018-06-30 DIAGNOSIS — Z791 Long term (current) use of non-steroidal anti-inflammatories (NSAID): Secondary | ICD-10-CM

## 2018-06-30 DIAGNOSIS — E210 Primary hyperparathyroidism: Secondary | ICD-10-CM | POA: Diagnosis present

## 2018-06-30 DIAGNOSIS — M25551 Pain in right hip: Secondary | ICD-10-CM | POA: Diagnosis present

## 2018-06-30 DIAGNOSIS — Z79899 Other long term (current) drug therapy: Secondary | ICD-10-CM

## 2018-06-30 DIAGNOSIS — I10 Essential (primary) hypertension: Secondary | ICD-10-CM | POA: Diagnosis present

## 2018-06-30 HISTORY — PX: TOTAL HIP ARTHROPLASTY: SHX124

## 2018-06-30 LAB — TYPE AND SCREEN
ABO/RH(D): O POS
Antibody Screen: NEGATIVE

## 2018-06-30 SURGERY — ARTHROPLASTY, HIP, TOTAL,POSTERIOR APPROACH
Anesthesia: Spinal | Laterality: Right

## 2018-06-30 MED ORDER — CLINDAMYCIN PHOSPHATE 900 MG/50ML IV SOLN
INTRAVENOUS | Status: AC
  Filled 2018-06-30: qty 50

## 2018-06-30 MED ORDER — PROPOFOL 500 MG/50ML IV EMUL
INTRAVENOUS | Status: DC | PRN
  Administered 2018-06-30: 50 ug/kg/min via INTRAVENOUS

## 2018-06-30 MED ORDER — ACETAMINOPHEN 10 MG/ML IV SOLN
INTRAVENOUS | Status: DC | PRN
  Administered 2018-06-30: 1000 mg via INTRAVENOUS

## 2018-06-30 MED ORDER — SODIUM CHLORIDE 0.9 % IV SOLN
INTRAVENOUS | Status: DC
  Administered 2018-06-30 – 2018-07-01 (×4): via INTRAVENOUS

## 2018-06-30 MED ORDER — SODIUM CHLORIDE 0.9 % IV SOLN
INTRAVENOUS | Status: DC | PRN
  Administered 2018-06-30: 1000 mL

## 2018-06-30 MED ORDER — BUPIVACAINE LIPOSOME 1.3 % IJ SUSP
INTRAMUSCULAR | Status: AC
  Filled 2018-06-30: qty 20

## 2018-06-30 MED ORDER — TRANEXAMIC ACID 1000 MG/10ML IV SOLN
INTRAVENOUS | Status: AC
  Filled 2018-06-30: qty 10

## 2018-06-30 MED ORDER — BISACODYL 10 MG RE SUPP: 10.0000 mg | Freq: Every day | RECTAL | Status: DC | PRN

## 2018-06-30 MED ORDER — ONDANSETRON HCL 4 MG PO TABS: 4.0000 mg | ORAL_TABLET | Freq: Four times a day (QID) | ORAL | Status: DC | PRN

## 2018-06-30 MED ORDER — LACTATED RINGERS IV SOLN
INTRAVENOUS | Status: DC
  Administered 2018-06-30: 10:00:00 via INTRAVENOUS

## 2018-06-30 MED ORDER — DEXAMETHASONE SODIUM PHOSPHATE 10 MG/ML IJ SOLN
INTRAMUSCULAR | Status: AC
  Filled 2018-06-30: qty 1

## 2018-06-30 MED ORDER — DIPHENHYDRAMINE HCL 12.5 MG/5ML PO ELIX: 12.5000 mg | ORAL_SOLUTION | ORAL | Status: DC | PRN

## 2018-06-30 MED ORDER — ACETAMINOPHEN 10 MG/ML IV SOLN
INTRAVENOUS | Status: AC
  Filled 2018-06-30: qty 100

## 2018-06-30 MED ORDER — FAMOTIDINE 20 MG PO TABS
20.0000 mg | ORAL_TABLET | Freq: Once | ORAL | Status: AC
  Administered 2018-06-30: 20 mg via ORAL

## 2018-06-30 MED ORDER — METOCLOPRAMIDE HCL 10 MG PO TABS: 5.0000 mg | ORAL_TABLET | Freq: Three times a day (TID) | ORAL | Status: DC | PRN

## 2018-06-30 MED ORDER — CLINDAMYCIN PHOSPHATE 600 MG/50ML IV SOLN
600.0000 mg | Freq: Four times a day (QID) | INTRAVENOUS | Status: AC
  Administered 2018-06-30 – 2018-07-01 (×3): 600 mg via INTRAVENOUS
  Filled 2018-06-30 (×3): qty 50

## 2018-06-30 MED ORDER — SODIUM CHLORIDE FLUSH 0.9 % IV SOLN
INTRAVENOUS | Status: AC
  Filled 2018-06-30: qty 40

## 2018-06-30 MED ORDER — SODIUM CHLORIDE 0.9 % IV SOLN
INTRAVENOUS | Status: DC | PRN
  Administered 2018-06-30: 60 mL

## 2018-06-30 MED ORDER — TRANEXAMIC ACID 1000 MG/10ML IV SOLN
INTRAVENOUS | Status: DC | PRN
  Administered 2018-06-30: 1000 mg via TOPICAL

## 2018-06-30 MED ORDER — PROPOFOL 500 MG/50ML IV EMUL
INTRAVENOUS | Status: AC
  Filled 2018-06-30: qty 50

## 2018-06-30 MED ORDER — BUPIVACAINE-EPINEPHRINE (PF) 0.5% -1:200000 IJ SOLN
INTRAMUSCULAR | Status: DC | PRN
  Administered 2018-06-30: 30 mL via PERINEURAL

## 2018-06-30 MED ORDER — PROPOFOL 10 MG/ML IV BOLUS
INTRAVENOUS | Status: DC | PRN
  Administered 2018-06-30 (×3): 20 mg via INTRAVENOUS

## 2018-06-30 MED ORDER — BUPIVACAINE-EPINEPHRINE (PF) 0.5% -1:200000 IJ SOLN
INTRAMUSCULAR | Status: AC
  Filled 2018-06-30: qty 90

## 2018-06-30 MED ORDER — GENTAMICIN SULFATE 40 MG/ML IJ SOLN
INTRAMUSCULAR | Status: AC
  Filled 2018-06-30: qty 8

## 2018-06-30 MED ORDER — BUPIVACAINE HCL (PF) 0.5 % IJ SOLN
INTRAMUSCULAR | Status: DC | PRN
  Administered 2018-06-30: 3 mL

## 2018-06-30 MED ORDER — FENTANYL CITRATE (PF) 100 MCG/2ML IJ SOLN: 25.0000 ug | INTRAMUSCULAR | Status: DC | PRN

## 2018-06-30 MED ORDER — SODIUM CHLORIDE 0.9 % IV BOLUS
500.0000 mL | Freq: Once | INTRAVENOUS | Status: AC
  Administered 2018-06-30: 500 mL via INTRAVENOUS

## 2018-06-30 MED ORDER — LIDOCAINE HCL (PF) 2 % IJ SOLN
INTRAMUSCULAR | Status: AC
  Filled 2018-06-30: qty 10

## 2018-06-30 MED ORDER — DEXAMETHASONE SODIUM PHOSPHATE 10 MG/ML IJ SOLN
INTRAMUSCULAR | Status: DC | PRN
  Administered 2018-06-30: 5 mg via INTRAVENOUS

## 2018-06-30 MED ORDER — LIDOCAINE HCL (CARDIAC) PF 100 MG/5ML IV SOSY
PREFILLED_SYRINGE | INTRAVENOUS | Status: DC | PRN
  Administered 2018-06-30: 60 mg via INTRAVENOUS

## 2018-06-30 MED ORDER — ONDANSETRON HCL 4 MG/2ML IJ SOLN: 4.0000 mg | Freq: Four times a day (QID) | INTRAMUSCULAR | Status: DC | PRN

## 2018-06-30 MED ORDER — ONDANSETRON HCL 4 MG/2ML IJ SOLN: 4.0000 mg | Freq: Once | INTRAMUSCULAR | Status: DC | PRN

## 2018-06-30 MED ORDER — FERROUS SULFATE 325 (65 FE) MG PO TABS
325.0000 mg | ORAL_TABLET | Freq: Every day | ORAL | Status: DC
  Administered 2018-06-30 – 2018-07-03 (×4): 325 mg via ORAL
  Filled 2018-06-30 (×4): qty 1

## 2018-06-30 MED ORDER — FAMOTIDINE 20 MG PO TABS
ORAL_TABLET | ORAL | Status: AC
  Administered 2018-06-30: 20 mg via ORAL
  Filled 2018-06-30: qty 1

## 2018-06-30 MED ORDER — MIDAZOLAM HCL 2 MG/2ML IJ SOLN
INTRAMUSCULAR | Status: DC | PRN
  Administered 2018-06-30 (×2): 1 mg via INTRAVENOUS

## 2018-06-30 MED ORDER — ENOXAPARIN SODIUM 40 MG/0.4ML ~~LOC~~ SOLN
40.0000 mg | SUBCUTANEOUS | Status: DC
  Administered 2018-07-01 – 2018-07-03 (×3): 40 mg via SUBCUTANEOUS
  Filled 2018-06-30 (×3): qty 0.4

## 2018-06-30 MED ORDER — METOCLOPRAMIDE HCL 5 MG/ML IJ SOLN: 5.0000 mg | Freq: Three times a day (TID) | INTRAMUSCULAR | Status: DC | PRN

## 2018-06-30 MED ORDER — TRAMADOL HCL 50 MG PO TABS
50.0000 mg | ORAL_TABLET | Freq: Four times a day (QID) | ORAL | Status: DC | PRN
  Administered 2018-06-30 – 2018-07-01 (×3): 50 mg via ORAL
  Filled 2018-06-30 (×4): qty 1

## 2018-06-30 MED ORDER — ACETAMINOPHEN 500 MG PO TABS
1000.0000 mg | ORAL_TABLET | Freq: Four times a day (QID) | ORAL | Status: AC
  Administered 2018-06-30 – 2018-07-01 (×3): 1000 mg via ORAL
  Filled 2018-06-30 (×3): qty 2

## 2018-06-30 MED ORDER — FLEET ENEMA 7-19 GM/118ML RE ENEM: 1.0000 | ENEMA | Freq: Once | RECTAL | Status: DC | PRN

## 2018-06-30 MED ORDER — DOCUSATE SODIUM 100 MG PO CAPS
100.0000 mg | ORAL_CAPSULE | Freq: Two times a day (BID) | ORAL | Status: DC
  Administered 2018-06-30 – 2018-07-02 (×6): 100 mg via ORAL
  Filled 2018-06-30 (×7): qty 1

## 2018-06-30 MED ORDER — LISINOPRIL 10 MG PO TABS
10.0000 mg | ORAL_TABLET | Freq: Every day | ORAL | Status: DC
  Filled 2018-06-30 (×2): qty 1

## 2018-06-30 MED ORDER — MAGNESIUM HYDROXIDE 400 MG/5ML PO SUSP
30.0000 mL | Freq: Every day | ORAL | Status: DC | PRN
  Administered 2018-07-01: 30 mL via ORAL
  Filled 2018-06-30 (×2): qty 30

## 2018-06-30 MED ORDER — SODIUM CHLORIDE 0.9 % IV SOLN
INTRAVENOUS | Status: DC | PRN
  Administered 2018-06-30: 15 ug/min via INTRAVENOUS

## 2018-06-30 MED ORDER — ONDANSETRON HCL 4 MG/2ML IJ SOLN
INTRAMUSCULAR | Status: AC
  Filled 2018-06-30: qty 2

## 2018-06-30 MED ORDER — ACETAMINOPHEN 325 MG PO TABS
325.0000 mg | ORAL_TABLET | Freq: Four times a day (QID) | ORAL | Status: DC | PRN
  Administered 2018-07-02 – 2018-07-03 (×2): 650 mg via ORAL
  Filled 2018-06-30 (×3): qty 2

## 2018-06-30 MED ORDER — MIDAZOLAM HCL 2 MG/2ML IJ SOLN
INTRAMUSCULAR | Status: AC
  Filled 2018-06-30: qty 2

## 2018-06-30 MED ORDER — PHENYLEPHRINE HCL 10 MG/ML IJ SOLN
INTRAMUSCULAR | Status: AC
  Filled 2018-06-30: qty 1

## 2018-06-30 MED ORDER — ONDANSETRON HCL 4 MG/2ML IJ SOLN
INTRAMUSCULAR | Status: DC | PRN
  Administered 2018-06-30: 4 mg via INTRAVENOUS

## 2018-06-30 MED ORDER — OXYCODONE HCL 5 MG PO TABS
5.0000 mg | ORAL_TABLET | ORAL | Status: DC | PRN
  Administered 2018-06-30 – 2018-07-01 (×3): 5 mg via ORAL
  Filled 2018-06-30 (×3): qty 1

## 2018-06-30 MED ORDER — HYDROMORPHONE HCL 1 MG/ML IJ SOLN: 0.2500 mg | INTRAMUSCULAR | Status: DC | PRN

## 2018-06-30 SURGICAL SUPPLY — 61 items
BIT DRILL RINGLOC QUICK CONN (BIT) ×1 IMPLANT
BLADE SAGITTAL WIDE XTHICK NO (BLADE) ×3 IMPLANT
BLADE SURG SZ20 CARB STEEL (BLADE) ×3 IMPLANT
CANISTER SUCT 1200ML W/VALVE (MISCELLANEOUS) ×3 IMPLANT
CANISTER SUCT 3000ML PPV (MISCELLANEOUS) ×6 IMPLANT
CHLORAPREP W/TINT 26ML (MISCELLANEOUS) ×3 IMPLANT
COVER WAND RF STERILE (DRAPES) IMPLANT
DRAPE IMP U-DRAPE 54X76 (DRAPES) ×3 IMPLANT
DRAPE INCISE IOBAN 66X60 STRL (DRAPES) ×3 IMPLANT
DRAPE SHEET LG 3/4 BI-LAMINATE (DRAPES) ×3 IMPLANT
DRAPE SURG 17X11 SM STRL (DRAPES) ×6 IMPLANT
DRAPE TABLE BACK 80X90 (DRAPES) ×3 IMPLANT
DRILL BIT RINGLOC QUICK CONN (BIT) ×2
DRSG OPSITE POSTOP 4X10 (GAUZE/BANDAGES/DRESSINGS) ×3 IMPLANT
ELECT BLADE 6.5 EXT (BLADE) ×3 IMPLANT
ELECT CAUTERY BLADE 6.4 (BLADE) ×3 IMPLANT
GLOVE BIO SURGEON STRL SZ7.5 (GLOVE) ×12 IMPLANT
GLOVE BIO SURGEON STRL SZ8 (GLOVE) ×12 IMPLANT
GLOVE BIOGEL PI IND STRL 8 (GLOVE) ×1 IMPLANT
GLOVE BIOGEL PI INDICATOR 8 (GLOVE) ×2
GLOVE INDICATOR 8.0 STRL GRN (GLOVE) ×3 IMPLANT
GOWN STRL REUS W/ TWL LRG LVL3 (GOWN DISPOSABLE) ×1 IMPLANT
GOWN STRL REUS W/ TWL XL LVL3 (GOWN DISPOSABLE) ×1 IMPLANT
GOWN STRL REUS W/TWL LRG LVL3 (GOWN DISPOSABLE) ×2
GOWN STRL REUS W/TWL XL LVL3 (GOWN DISPOSABLE) ×2
HEAD CERAMIC BIOLOX DELTA 36MM (Head) ×3 IMPLANT
HIP SHELL ACETAB 3H 50MM (Hips) ×3 IMPLANT
HOOD PEEL AWAY FLYTE STAYCOOL (MISCELLANEOUS) ×9 IMPLANT
KIT TURNOVER KIT A (KITS) ×3 IMPLANT
LINER ACETABULAR G7 SZ36 D (Liner) ×3 IMPLANT
MAT ABSORB  FLUID 56X50 GRAY (MISCELLANEOUS) ×2
MAT ABSORB FLUID 56X50 GRAY (MISCELLANEOUS) ×1 IMPLANT
NDL SAFETY ECLIPSE 18X1.5 (NEEDLE) ×2 IMPLANT
NEEDLE FILTER BLUNT 18X 1/2SAF (NEEDLE) ×2
NEEDLE FILTER BLUNT 18X1 1/2 (NEEDLE) ×1 IMPLANT
NEEDLE HYPO 18GX1.5 SHARP (NEEDLE) ×4
NEEDLE SPNL 20GX3.5 QUINCKE YW (NEEDLE) ×3 IMPLANT
PACK HIP PROSTHESIS (MISCELLANEOUS) ×3 IMPLANT
PILLOW ABDUCTION FOAM SM (MISCELLANEOUS) ×3 IMPLANT
PIN STEINMAN 3/16 (PIN) ×3 IMPLANT
PIN STEINMANN 3/16X9 BAY 6PK (Pin) ×1 IMPLANT
PULSAVAC PLUS IRRIG FAN TIP (DISPOSABLE) ×3
SCREW ACETABULAR G7 6.5X30 (Screw) ×3 IMPLANT
SHELL ACETAB HIP 3H 50MM (Hips) ×1 IMPLANT
SOL .9 NS 3000ML IRR  AL (IV SOLUTION) ×2
SOL .9 NS 3000ML IRR UROMATIC (IV SOLUTION) ×1 IMPLANT
SPONGE LAP 18X18 RF (DISPOSABLE) IMPLANT
ST PIN 3/16X9 BAY 6PK (Pin) ×3 IMPLANT
STAPLER SKIN PROX 35W (STAPLE) ×3 IMPLANT
STEM COLLARLESS 12X44MM (Stem) ×3 IMPLANT
SUT TICRON 2-0 30IN 311381 (SUTURE) ×9 IMPLANT
SUT VIC AB 0 CT1 36 (SUTURE) ×3 IMPLANT
SUT VIC AB 1 CT1 36 (SUTURE) ×6 IMPLANT
SUT VIC AB 2-0 CT1 (SUTURE) ×12 IMPLANT
SYR 10ML LL (SYRINGE) ×3 IMPLANT
SYR 20CC LL (SYRINGE) ×3 IMPLANT
SYR 30ML LL (SYRINGE) ×9 IMPLANT
TAPE TRANSPORE STRL 2 31045 (GAUZE/BANDAGES/DRESSINGS) ×3 IMPLANT
TIP FAN IRRIG PULSAVAC PLUS (DISPOSABLE) ×1 IMPLANT
TRAY FOLEY MTR SLVR 16FR STAT (SET/KITS/TRAYS/PACK) ×3 IMPLANT
WATER STERILE IRR 1000ML POUR (IV SOLUTION) ×3 IMPLANT

## 2018-06-30 NOTE — Transfer of Care (Signed)
Immediate Anesthesia Transfer of Care Note  Patient: Vanessa Cameron  Procedure(s) Performed: TOTAL HIP ARTHROPLASTY (Right )  Patient Location: PACU  Anesthesia Type:Spinal  Level of Consciousness: awake, alert , oriented and patient cooperative  Airway & Oxygen Therapy: Patient Spontanous Breathing  Post-op Assessment: Report given to RN and Post -op Vital signs reviewed and stable  Post vital signs: Reviewed and stable  Last Vitals:  Vitals Value Taken Time  BP    Temp 36.6 C 06/30/2018  1:11 PM  Pulse    Resp    SpO2      Last Pain:  Vitals:   06/30/18 0931  TempSrc: Temporal  PainSc: 5          Complications: No apparent anesthesia complications

## 2018-06-30 NOTE — Anesthesia Procedure Notes (Signed)
Spinal  Patient location during procedure: OR Start time: 06/30/2018 10:35 AM End time: 06/30/2018 10:41 AM Staffing Anesthesiologist: Naomie Dean, MD Resident/CRNA: Dava Najjar, CRNA Performed: resident/CRNA  Preanesthetic Checklist Completed: patient identified, site marked, surgical consent, pre-op evaluation, timeout performed, IV checked, risks and benefits discussed and monitors and equipment checked Spinal Block Patient position: sitting Prep: Betadine and site prepped and draped Patient monitoring: heart rate, continuous pulse ox and blood pressure Approach: midline Location: L3-4 Injection technique: single-shot Needle Needle type: Pencan  Needle gauge: 24 G Needle length: 10 cm Assessment Sensory level: T6

## 2018-06-30 NOTE — Evaluation (Signed)
Physical Therapy Evaluation Patient Details Name: Vanessa Cameron MRN: [id number] DOB: [birth year] Today's Date: 06/30/2018   History of Present Illness  78 y/o female s/p R total hip replacement (posterior approach) 06/30/18.  Clinical Impression  Pt generally did well with POD0 PT exam and subsequent exercises.  She showed minimal limitations due to pain, was able to do most exercises with at least AROM (or most with light resistance) and did well with mobility and standing though she had issues with BP drop in standing and could not attempt more than a few small steps.  Will further assess tomorrow for out of bed/ambulation tasks.      Follow Up Recommendations Follow surgeon's recommendation for DC plan and follow-up therapies    Equipment Recommendations  (has borrowed walker, may need BSC, TBD)    Recommendations for Other Services       Precautions / Restrictions Precautions Precautions: Posterior Hip;Fall Precaution Comments: able to recall 1 precaution with light cuing, "remembered" others once PT told her Restrictions Weight Bearing Restrictions: Yes RLE Weight Bearing: Weight bearing as tolerated      Mobility  Bed Mobility Overal bed mobility: Needs Assistance Bed Mobility: Supine to Sit     Supine to sit: Min guard;Min assist     General bed mobility comments: Pt did well getting to EOB, able to scoot hips laterally and do majority of getting to EOB and upright w/o direct assist.  Cuing t/o.  Transfers Overall transfer level: Needs assistance Equipment used: Rolling walker (2 wheeled) Transfers: Sit to/from Stand Sit to Stand: Min assist         General transfer comment: Pt needed cues for set up and sequencing, only very light assist to shift weight forward and maintain balance.  Some initial lightheadedness, but this subsided.  Ambulation/Gait Ambulation/Gait assistance: Min guard Gait Distance (Feet): 3 Feet Assistive device: Rolling walker (2  wheeled)       General Gait Details: Pt was able to take a few small, labored steps with heavy reliance on the walker, however she had feeling of lightheadedness and needed to sit  Stairs            Wheelchair Mobility    Modified Rankin (Stroke Patients Only)       Balance Overall balance assessment: Independent                                           Pertinent Vitals/Pain Pain Assessment: 0-10 Pain Score: 2  Pain Location: reports relatively minimal pain at rest, some increased with activity    Home Living Family/patient expects to be discharged to:: Skilled nursing facility Living Arrangements: Alone               Additional Comments: Pt does not have steps to enter, one floor, borrowed 2 & 4 WWs    Prior Function Level of Independence: Independent         Comments: Pt normally able to be very active, had recently started to need RW 2/2 hip pain     Hand Dominance        Extremity/Trunk Assessment   Upper Extremity Assessment Upper Extremity Assessment: Overall WFL for tasks assessed    Lower Extremity Assessment Lower Extremity Assessment: Overall WFL for tasks assessed(expected post-op limitations, but good AROM, SLR)       Communication   Communication: No difficulties  Cognition Arousal/Alertness: Awake/alert Behavior During Therapy: WFL for tasks assessed/performed Overall Cognitive Status: Within Functional Limits for tasks assessed                                        General Comments General comments (skin integrity, edema, etc.): post standing BPs taking in recliner were low, 50s/~40    Exercises Total Joint Exercises Ankle Circles/Pumps: AROM;10 reps Quad Sets: Strengthening;10 reps Gluteal Sets: AROM;10 reps Heel Slides: Strengthening;10 reps Hip ABduction/ADduction: 10 reps;AROM Straight Leg Raises: 10 reps;AROM   Assessment/Plan    PT Assessment Patient needs continued PT  services  PT Problem List Decreased strength;Pain;Decreased range of motion;Decreased activity tolerance;Decreased balance;Decreased mobility;Decreased knowledge of use of DME;Decreased safety awareness       PT Treatment Interventions DME instruction;Gait training;Functional mobility training;Therapeutic activities;Therapeutic exercise;Balance training;Neuromuscular re-education;Patient/family education    PT Goals (Current goals can be found in the Care Plan section)  Acute Rehab PT Goals Patient Stated Goal: get back to independent function PT Goal Formulation: With patient Time For Goal Achievement: 07/14/18 Potential to Achieve Goals: Good    Frequency BID   Barriers to discharge        Co-evaluation               AM-PAC PT "6 Clicks" Mobility  Outcome Measure Help needed turning from your back to your side while in a flat bed without using bedrails?: A Little Help needed moving from lying on your back to sitting on the side of a flat bed without using bedrails?: A Little Help needed moving to and from a bed to a chair (including a wheelchair)?: A Little Help needed standing up from a chair using your arms (e.g., wheelchair or bedside chair)?: A Little Help needed to walk in hospital room?: A Lot Help needed climbing 3-5 steps with a railing? : A Lot 6 Click Score: 16    End of Session Equipment Utilized During Treatment: Gait belt Activity Tolerance: (limited due to orthostatics) Patient left: with chair alarm set;with call bell/phone within reach;with nursing/sitter in room Nurse Communication: Mobility status(BP status) PT Visit Diagnosis: Muscle weakness (generalized) (M62.81);Difficulty in walking, not elsewhere classified (R26.2);Pain Pain - Right/Left: Right Pain - part of body: Hip    Time: [id number] PT Time Calculation (min) (ACUTE ONLY): 33 min   Charges:   PT Evaluation $PT Eval Low Complexity: 1 Low PT Treatments $Therapeutic Exercise: 8-22  mins        Malachi ProGalen R Emery Dupuy, DPT 06/30/2018, 5:05 PM

## 2018-06-30 NOTE — Anesthesia Preprocedure Evaluation (Signed)
Anesthesia Evaluation  Patient identified by MRN, date of birth, ID band Patient awake    Reviewed: Allergy & Precautions, NPO status , Patient's Chart, lab work & pertinent test results  History of Anesthesia Complications Negative for: history of anesthetic complications  Airway Mallampati: II       Dental  (+) Partial Upper   Pulmonary neg sleep apnea, neg COPD, former smoker,           Cardiovascular hypertension, Pt. on medications (-) Past MI and (-) CHF (-) dysrhythmias + Valvular Problems/Murmurs (murmur, no tx)      Neuro/Psych neg Seizures    GI/Hepatic Neg liver ROS, neg GERD  ,  Endo/Other  negative endocrine ROS  Renal/GU negative Renal ROS     Musculoskeletal   Abdominal   Peds  Hematology  (+) anemia ,   Anesthesia Other Findings   Reproductive/Obstetrics                             Anesthesia Physical Anesthesia Plan  ASA: II  Anesthesia Plan: Spinal   Post-op Pain Management:    Induction:   PONV Risk Score and Plan:   Airway Management Planned:   Additional Equipment:   Intra-op Plan:   Post-operative Plan:   Informed Consent: I have reviewed the patients History and Physical, chart, labs and discussed the procedure including the risks, benefits and alternatives for the proposed anesthesia with the patient or authorized representative who has indicated his/her understanding and acceptance.       Plan Discussed with:   Anesthesia Plan Comments:         Anesthesia Quick Evaluation

## 2018-06-30 NOTE — [place] FL2 (Signed)
[place] MEDICAID FL2 LEVEL OF CARE SCREENING TOOL     IDENTIFICATION  Patient Name: Vanessa Cameron Birthdate: [birth year] Sex: female Admission Date (Current Location): 06/30/2018  [place]   Facility and Address:  [facility], [address], [place], [place] [address]      Provider Number: [id number]  Attending Physician Name and Address:  Christena FlakePoggi, John J, MD  Relative Name and Phone Number:       Current Level of Care: Hospital Recommended Level of Care: Skilled Nursing Facility Prior Approval Number:    Date Approved/Denied:   PASRR Number: [id number])  Discharge Plan: SNF    Current Diagnoses: Patient Active Problem List   Diagnosis Date Noted  . 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 Primary osteoarthritis of right hip 08/19/2017  . Acute right-sided low back pain with sciatica 06/08/2017  . Right hip pain 06/08/2017  . Anemia 01/09/2014  . Hypertension 01/09/2014  . Primary hyperparathyroidism (HCC) 01/09/2014    Orientation RESPIRATION BLADDER Height & Weight     Self, Time, Situation, Place  Normal Continent Weight: 128 lb (58.1 kg) Height:  5\' 4"  (162.6 cm)  BEHAVIORAL SYMPTOMS/MOOD NEUROLOGICAL BOWEL NUTRITION STATUS      Continent Diet(Diet: Regular )  AMBULATORY STATUS COMMUNICATION OF NEEDS Skin   Extensive Assist Verbally Surgical wounds(Incision: Right Hip. )                       Personal Care Assistance Level of Assistance  Bathing, Feeding, Dressing Bathing Assistance: Limited assistance Feeding assistance: Independent Dressing Assistance: Limited assistance     Functional Limitations Info  Sight, Hearing, Speech Sight Info: Adequate Hearing Info: Adequate Speech Info: Adequate    SPECIAL CARE FACTORS FREQUENCY  PT (By licensed PT), OT (By licensed OT)     PT Frequency: (5) OT Frequency: (5)            Contractures      Additional Factors Info   Code Status, Allergies Code Status Info: (Full Code. ) Allergies Info: (Penicillins, Nsaids)           Current Medications (06/30/2018):  This is the current hospital active medication list Current Facility-Administered Medications  Medication Dose Route Frequency Provider Last Rate Last Dose  . 0.9 %  sodium chloride infusion   Intravenous Continuous Poggi, Excell SeltzerJohn J, MD   Stopped at 06/30/18 1604  . acetaminophen (TYLENOL) tablet 1,000 mg  1,000 mg Oral Q6H Poggi, Excell SeltzerJohn J, MD      . Melene Muller[START ON 07/01/2018] acetaminophen (TYLENOL) tablet 325-650 mg  325-650 mg Oral Q6H PRN Poggi, Excell SeltzerJohn J, MD      . bisacodyl (DULCOLAX) suppository 10 mg  10 mg Rectal Daily PRN Poggi, Excell SeltzerJohn J, MD      . clindamycin (CLEOCIN) IVPB 600 mg  600 mg Intravenous Q6H Poggi, Excell SeltzerJohn J, MD 100 mL/hr at 06/30/18 1606    . diphenhydrAMINE (BENADRYL) 12.5 MG/5ML elixir 12.5-25 mg  12.5-25 mg Oral Q4H PRN Poggi, Excell SeltzerJohn J, MD      . docusate sodium (COLACE) capsule 100 mg  100 mg Oral BID Poggi, Excell SeltzerJohn J, MD   100 mg at 06/30/18 1601  . [START ON 07/01/2018] enoxaparin (LOVENOX) injection 40 mg  40 mg Subcutaneous Q24H Poggi, Excell SeltzerJohn J, MD      . ferrous sulfate tablet 325 mg  325 mg Oral Daily Poggi, Excell SeltzerJohn J, MD   325 mg at 06/30/18 1601  .  HYDROmorphone (DILAUDID) injection 0.25-0.5 mg  0.25-0.5 mg Intravenous Q2H PRN Poggi, Excell SeltzerJohn J, MD      . lisinopril (PRINIVIL,ZESTRIL) tablet 10 mg  10 mg Oral Daily Poggi, Excell SeltzerJohn J, MD      . magnesium hydroxide (MILK OF MAGNESIA) suspension 30 mL  30 mL Oral Daily PRN Poggi, Excell SeltzerJohn J, MD      . metoCLOPramide (REGLAN) tablet 5-10 mg  5-10 mg Oral Q8H PRN Poggi, Excell SeltzerJohn J, MD       Or  . metoCLOPramide (REGLAN) injection 5-10 mg  5-10 mg Intravenous Q8H PRN Poggi, Excell SeltzerJohn J, MD      . ondansetron (ZOFRAN) tablet 4 mg  4 mg Oral Q6H PRN Poggi, Excell SeltzerJohn J, MD       Or  . ondansetron (ZOFRAN) injection 4 mg  4 mg Intravenous Q6H PRN Poggi, Excell SeltzerJohn J, MD      . oxyCODONE (Oxy IR/ROXICODONE) immediate release tablet 5-10  mg  5-10 mg Oral Q4H PRN Poggi, Excell SeltzerJohn J, MD      . sodium phosphate (FLEET) 7-19 GM/118ML enema 1 enema  1 enema Rectal Once PRN Poggi, Excell SeltzerJohn J, MD      . traMADol Janean Sark(ULTRAM) tablet 50 mg  50 mg Oral Q6H PRN Poggi, Excell SeltzerJohn J, MD   50 mg at 06/30/18 1601     Discharge Medications: Please see discharge summary for a list of discharge medications.  Relevant Imaging Results:  Relevant Lab Results:   Additional Information (SSN: [id number])  [place], Darleen CrockerBailey M, LCSW

## 2018-06-30 NOTE — Progress Notes (Signed)
BP after bolus 114/66, pt feeling better and back to baseline. Maintenance IVF restarted.

## 2018-06-30 NOTE — Anesthesia Post-op Follow-up Note (Signed)
Anesthesia QCDR form completed.        

## 2018-06-30 NOTE — H&P (Signed)
Paper H&P to be scanned into permanent record. H&P reviewed and patient re-examined. No changes. 

## 2018-06-30 NOTE — Op Note (Signed)
06/30/2018  1:10 PM  Patient:   Vanessa Cameron  Pre-Op Diagnosis:   Degenerative joint disease, right hip.  Post-Op Diagnosis:   Same.  Procedure:   Right total hip arthroplasty.  Surgeon:   Maryagnes AmosJ. Jeffrey , MD  Assistant:   Horris LatinoLance McGhee, PA-C; Eulis ManlyMorgan Stonewall, PA-S  Anesthesia:   Spinal  Findings:   As above.  Complications:   None  EBL:   150 cc  Fluids:   500 cc crystalloid  UOP:   150 cc  TT:   None  Drains:   None  Closure:   Staples  Implants:   Biomet press-fit system with a #12 standard offset reduced proximal profile Echo femoral stem, a 50 mm acetabular shell with an E-poly hi-wall liner, and a 36 mm ceramic head with a +0 mm neck.  Brief Clinical Note:   The patient is a 78 year old female with a long history of progressively worsening right hip/groin pain. Her symptoms have progressed despite medications, activity modification, etc. Her history examination consistent with severe degenerative joint disease associated with superior collapse of the femoral head which was demonstrated on preoperative x-ray imaging. The patient presents at this time for a right total hip arthroplasty.   Procedure:   The patient was brought into the operating room. After adequate spinal anesthesia was obtained, the patient was repositioned in the left lateral decubitus position and secured using a lateral hip positioner. The right hip and lower extremity were prepped with ChloroPrep solution before being draped sterilely. Preoperative antibiotics were administered. A timeout was performed to verify the appropriate surgical site before a standard posterior approach to the hip was made through an approximately 6-7 inch incision. The incision was carried down through the subcutaneous tissues to expose the gluteal fascia and proximal end of the iliotibial band. These structures were split the length of the incision and the Charnley self-retaining hip retractor placed. The bursal tissues were  swept posteriorly to expose the short external rotators. The anterior border of the piriformis tendon was identified and this plane developed down through the capsule to enter the joint. A flap of tissue was elevated off the posterior aspect of the femoral neck and greater trochanter and retracted posteriorly. This flap included the piriformis tendon, the short external rotators, and the posterior capsule. The soft tissues were elevated off the lateral aspect of the ilium and a large Steinmann pin placed bicortically. With the right leg aligned over the left, a drill bit was placed into the greater trochanter parallel to the Steinmann pin and the distance between these two pins measured in order to optimize leg lengths postoperatively. The drill bit was removed and the hip dislocated. The piriformis fossa was debrided of soft tissues before the intramedullary canal was accessed through this point using a triple step reamer. The canal was reamed sequentially beginning with a #7 tapered reamer and progressing to a #12 tapered reamer. This provided excellent circumferential chatter. Using the appropriate guide, a femoral neck cut was made 10-12 mm above the lesser trochanter. The femoral head was removed.  Attention was directed to the acetabular side. The labrum was debrided circumferentially before the ligamentum teres was removed using a large curette. A line was drawn on the drapes corresponding to the native version of the acetabulum. This line was used as a guide while the acetabulum was reamed sequentially beginning with a 41 mm reamer and progressing to a 49 mm reamer. This provided excellent circumferential chatter. The 49 mm trial acetabulum  was positioned and found to fit quite well. Therefore, the 50 mm acetabular shell was selected and impacted into place with care taken to maintain the appropriate version. Given the patient's thin posterior wall and superior acetabular rim, it was elected to better  stabilize the cup. Therefore, a single 30 mm acetabular screw was inserted superiorly to optimize the stability of the acetabular shell. The trial high wall liner was inserted.  Attention was redirected to the femoral side. A box osteotome was used to establish version before the canal was broached sequentially beginning with a #7 broach and progressing to a #12 broach. This was left in place and several trial reductions performed using both a standard and laterally offset neck options, as well as the -6 mm and +0 mm neck lengths. The permanent E-polyethylene hi-wall liner was impacted into the acetabular shell and its locking mechanism verified using a quarter-inch osteotome. Next, the #12 standard offset reduced proximal profile femoral stem was impacted into place with care taken to maintain the appropriate version. A repeat trial reduction was performed using the +0 mm neck length. The +0 mm neck length demonstrated excellent stability both in extension and external rotation as well as with flexion to 90 and internal rotation beyond 70. It also was stable in the position of sleep. In addition, leg lengths appeared to be restored appropriately, both by reassessing the position of the right leg over the left, as well as by measuring the distance between the Steinmann pin and the drill bit. The 36 mm ceramic head with the +0 mm neck adapter construct was put together on the back table before being impacted onto the stem of the femoral component. The Morse taper locking mechanism was verified using manual distraction before the head was relocated and placed through a range of motion with the findings as described above.  The wound was copiously irrigated with bacitracin saline solution via the jet lavage system before the peri-incisional and pericapsular tissues were injected with 30 cc of 0.5% Sensorcaine with epinephrine and 20 cc of Exparel diluted out to 60 cc with normal saline to help with postoperative  analgesia. The posterior flap was reapproximated to the posterior aspect of the greater trochanter using #2 Tycron interrupted sutures placed through drill holes. Several additional #2 Tycron interrupted sutures were used to reinforce this layer of closure. The iliotibial band was reapproximated using #1 Vicryl interrupted sutures before the gluteal fascia was closed using a running #1 Vicryl suture. At this point, 1 g of transexemic acid in 10 cc of normal saline was injected into the joint to help reduce postoperative bleeding. The subcutaneous tissues were closed in several layers using 2-0 Vicryl interrupted sutures before the skin was closed using staples. A sterile occlusive dressing was applied to the wound before the patient was placed into an abduction wedge pillow. The patient was then rolled back into the supine position on her hospital bed before being awakened and returned to the recovery room in satisfactory condition after tolerating the procedure well.

## 2018-06-30 NOTE — Progress Notes (Signed)
Pt reported feeling "extremely lightheaded" after PT session. BP was ranging 60s/40s. Pt was in chair, head was reclined. Dr. Joice Lofts on floor and ordered 500 cc fluid bolus. Bolus initiated and pt & daughter updated. Dr. Joice Lofts further instructed that after bolus if SBP is <90, give another 500 cc bolus.

## 2018-07-01 LAB — CBC WITH DIFFERENTIAL/PLATELET
ABS IMMATURE GRANULOCYTES: 0.04 10*3/uL (ref 0.00–0.07)
Basophils Absolute: 0 10*3/uL (ref 0.0–0.1)
Basophils Relative: 0 %
Eosinophils Absolute: 0 10*3/uL (ref 0.0–0.5)
Eosinophils Relative: 0 %
HCT: 28.6 % — ABNORMAL LOW (ref 36.0–46.0)
HEMOGLOBIN: 9.8 g/dL — AB (ref 12.0–15.0)
IMMATURE GRANULOCYTES: 1 %
Lymphocytes Relative: 16 %
Lymphs Abs: 1.4 10*3/uL (ref 0.7–4.0)
MCH: 33.3 pg (ref 26.0–34.0)
MCHC: 34.3 g/dL (ref 30.0–36.0)
MCV: 97.3 fL (ref 80.0–100.0)
Monocytes Absolute: 0.9 10*3/uL (ref 0.1–1.0)
Monocytes Relative: 11 %
NEUTROS PCT: 72 %
Neutro Abs: 6.1 10*3/uL (ref 1.7–7.7)
Platelets: 236 10*3/uL (ref 150–400)
RBC: 2.94 MIL/uL — ABNORMAL LOW (ref 3.87–5.11)
RDW: 11.6 % (ref 11.5–15.5)
WBC: 8.5 10*3/uL (ref 4.0–10.5)
nRBC: 0 % (ref 0.0–0.2)

## 2018-07-01 LAB — BASIC METABOLIC PANEL
Anion gap: 6 (ref 5–15)
BUN: 11 mg/dL (ref 8–23)
CHLORIDE: 97 mmol/L — AB (ref 98–111)
CO2: 25 mmol/L (ref 22–32)
Calcium: 8.3 mg/dL — ABNORMAL LOW (ref 8.9–10.3)
Creatinine, Ser: 0.65 mg/dL (ref 0.44–1.00)
GFR calc Af Amer: >60 mL/min (ref >60)
GFR calc non Af Amer: >60 mL/min (ref >60)
Glucose, Bld: 118 mg/dL — ABNORMAL HIGH (ref 70–99)
Potassium: 4 mmol/L (ref 3.5–5.1)
Sodium: 128 mmol/L — ABNORMAL LOW (ref 135–145)

## 2018-07-01 MED ORDER — CYCLOBENZAPRINE HCL 10 MG PO TABS
5.0000 mg | ORAL_TABLET | Freq: Three times a day (TID) | ORAL | Status: DC | PRN
  Administered 2018-07-01: 5 mg via ORAL
  Filled 2018-07-01: qty 1

## 2018-07-01 MED ORDER — OXYCODONE HCL 5 MG PO TABS: 5.0000 mg | ORAL_TABLET | ORAL | 0 refills | Status: DC | PRN

## 2018-07-01 MED ORDER — ENOXAPARIN SODIUM 40 MG/0.4ML ~~LOC~~ SOLN: 40.0000 mg | SUBCUTANEOUS | 0 refills | Status: DC

## 2018-07-01 MED ORDER — TRAMADOL HCL 50 MG PO TABS: 50.0000 mg | ORAL_TABLET | Freq: Four times a day (QID) | ORAL | 0 refills | Status: DC | PRN

## 2018-07-01 NOTE — Care Management Note (Signed)
Case Management Note  Patient Details  Name: Vanessa Cameron MRN: [id number] Date of Birth: 02-24-1941  Subjective/Objective:                  Met with the patient to discuss DC plan She states that she will be going to rehab She said that her pain is not doing well She stated that she does have a rolling walker at home I provided the [facility] list for choice per CMS.gov [facility] list was reviewed with the patient  The patient chooses to use  The patient has a PCP The patient uses [facility] in [place] for medications and can afford her medications The patient lives alone She has little support at home to help with her needs If she is able to go home she would like to use [facility] of the choice    Action/Plan: [facility] list provided to the patient for choice per CMS.gov Patient chose [facility] PT if she does end up going home  Expected Discharge Date:                  Expected Discharge Plan:     In-House Referral:     Discharge planning Services  CM Consult  Post Acute Care Choice:    Choice offered to:     DME Arranged:    DME Agency:     HH Arranged:    Ward Agency:     Status of Service:  Completed, signed off  If discussed at [employer], dates discussed:    Additional Comments:  Su Hilt, RN 07/01/2018, 2:42 PM

## 2018-07-01 NOTE — Progress Notes (Signed)
Subjective: 1 Day Post-Op Procedure(s) (LRB): TOTAL HIP ARTHROPLASTY (Right) Patient reports pain as 4 on 0-10 scale.   Patient is well, states that she is much improved after bolus of IVF yesterday. PT and care management to assist with discharge planning. Negative for chest pain and shortness of breath Fever: no Gastrointestinal:Negative for nausea and vomiting  Objective: Vital signs in last 24 hours: Temp:  [97.5 F (36.4 C)-99.4 F (37.4 C)] 98.3 F (36.8 C) (01/31 0400) Pulse Rate:  [66-82] 79 (01/31 0400) Resp:  [12-20] 20 (01/31 0400) BP: (110-142)/(61-85) 119/72 (01/31 0400) SpO2:  [97 %-100 %] 99 % (01/31 0400) Weight:  [58.1 kg] 58.1 kg (01/30 0931)  Intake/Output from previous day:  Intake/Output Summary (Last 24 hours) at 07/01/2018 0756 Last data filed at 07/01/2018 0552 Gross per 24 hour  Intake 2970.69 ml  Output 696 ml  Net 2274.69 ml    Intake/Output this shift: No intake/output data recorded.  Labs: Recent Labs    07/01/18 0620  HGB 9.8*   Recent Labs    07/01/18 0620  WBC 8.5  RBC 2.94*  HCT 28.6*  PLT 236   Recent Labs    07/01/18 0620  NA 128*  K 4.0  CL 97*  CO2 25  BUN 11  CREATININE 0.65  GLUCOSE 118*  CALCIUM 8.3*   No results for input(s): LABPT, INR in the last 72 hours.   EXAM General - Patient is Alert, Appropriate and Oriented Extremity - ABD soft Neurovascular intact Sensation intact distally Intact pulses distally Dorsiflexion/Plantar flexion intact Incision: dressing C/D/I No cellulitis present Dressing/Incision - clean, dry, no drainage Motor Function - intact, moving foot and toes well on exam.  Past Medical History:  Diagnosis Date  . Anemia    iron deficiency  . Arthritis   . Arthritis   . Heart murmur    nothing done for this  . History of kidney stones    had once when parathyroid gland was malfunctioning  . History of stomach ulcers   . Hypertension   . Thyroid disease     Assessment/Plan: 1 Day Post-Op Procedure(s) (LRB): TOTAL HIP ARTHROPLASTY (Right) Active Problems:   Status post total hip replacement, right  Estimated body mass index is 21.97 kg/m as calculated from the following:   Height as of this encounter: 5\' 4"  (1.626 m).   Weight as of this encounter: 58.1 kg. Advance diet Up with therapy D/C IV fluids when tolerating po intake.  Labs reviewed this AM, Hg 9.8.  Continue to monitor for possible transfusion, CBC ordered for tomorrow. Na 128, increase oral intake. BMP ordered for tomorrow morning. IV bolus delivered yesterday for hypotension.  BP 119/72 this AM. Up with therapy today. Pt is passing gas this AM. Plan for possible discharge on 07/02/18 pending progress with PT.  DVT Prophylaxis - Lovenox, Foot Pumps and TED hose Weight-Bearing as tolerated to right leg  J. Horris Latino, PA-C [facility] Orthopaedic Surgery 07/01/2018, 7:56 AM

## 2018-07-01 NOTE — Clinical Social Work Placement (Signed)
   CLINICAL SOCIAL WORK PLACEMENT  NOTE  Date:  07/01/2018  Patient Details  Name: Vanessa Cameron MRN: [id number] Date of Birth: 1941/02/23  Clinical Social Work is seeking post-discharge placement for this patient at the Skilled  Nursing Facility level of care (*CSW will initial, date and re-position this form in  chart as items are completed):  Yes   Patient/family provided with [facility] Clinical Social Work Department's list of facilities offering this level of care within the geographic area requested by the patient (or if unable, by the patient's family).  Yes   Patient/family informed of their freedom to choose among providers that offer the needed level of care, that participate in Medicare, Medicaid or managed care program needed by the patient, have an available bed and are willing to accept the patient.  Yes   Patient/family informed of [facility]'s ownership interest in [facility] and [facility], as well as of the fact that they are under no obligation to receive care at these facilities.  PASRR submitted to EDS on 06/30/18     PASRR number received on 06/30/18     Existing PASRR number confirmed on       FL2 transmitted to all facilities in geographic area requested by pt/family on 06/30/18     FL2 transmitted to all facilities within larger geographic area on       Patient informed that his/her managed care company has contracts with or will negotiate with certain facilities, including the following:        Yes   Patient/family informed of bed offers received.  Patient chooses bed at [facility] )     Physician recommends and patient chooses bed at      Patient to be transferred to   on  .  Patient to be transferred to facility by       Patient family notified on   of transfer.  Name of family member notified:        PHYSICIAN       Additional Comment:    _______________________________________________ Kendel Pesnell, Darleen Crocker, LCSW 07/01/2018,  3:32 PM

## 2018-07-01 NOTE — Anesthesia Postprocedure Evaluation (Signed)
Anesthesia Post Note  Patient: Vanessa Cameron  Procedure(s) Performed: TOTAL HIP ARTHROPLASTY (Right )  Patient location during evaluation: Nursing Unit Anesthesia Type: Spinal Level of consciousness: oriented and awake and alert Pain management: pain level controlled Vital Signs Assessment: post-procedure vital signs reviewed and stable Respiratory status: spontaneous breathing, respiratory function stable and patient connected to nasal cannula oxygen Cardiovascular status: blood pressure returned to baseline and stable Postop Assessment: no headache, no backache and no apparent nausea or vomiting Anesthetic complications: no     Last Vitals:  Vitals:   06/30/18 2308 07/01/18 0400  BP: 110/69 119/72  Pulse: 66 79  Resp: 20 20  Temp: 36.7 C 36.8 C  SpO2: 98% 99%    Last Pain:  Vitals:   07/01/18 0400  TempSrc: Oral  PainSc:                  Rica Mast

## 2018-07-01 NOTE — Discharge Instructions (Signed)
Instructions after Total Hip Replacement     J. Jeffrey Poggi, M.D.  J. Lance Shlomo Seres, PA-C     Dept. of Orthopaedics & Sports Medicine  [facility]  [address]  [place], [place]  [address]  Phone: [phone]   Fax: [phone]    DIET: . Drink plenty of non-alcoholic fluids. . Resume your normal diet. Include foods high in fiber.  ACTIVITY:  . You may use crutches or a walker with weight-bearing as tolerated, unless instructed otherwise. . You may be weaned off of the walker or crutches by your Physical Therapist.  . Do NOT reach below the level of your knees or cross your legs until allowed.    . Continue doing gentle exercises. Exercising will reduce the pain and swelling, increase motion, and prevent muscle weakness.   . Please continue to use the TED compression stockings for 6 weeks. You may remove the stockings at night, but should reapply them in the morning. . Do not drive or operate any equipment until instructed.  WOUND CARE:  . Continue to use ice packs periodically to reduce pain and swelling. . Keep the incision clean and dry. . You may bathe or shower after the staples are removed at the first office visit following surgery.  MEDICATIONS: . You may resume your regular medications. . Please take the pain medication as prescribed on the medication. . Do not take pain medication on an empty stomach. . You have been given a prescription for a blood thinner to prevent blood clots. Please take the medication as instructed. (NOTE: After completing a 2 week course of Lovenox, take one Enteric-coated aspirin once a day.) . Pain medications and iron supplements can cause constipation. Use a stool softener (Senokot or Colace) on a daily basis and a laxative (dulcolax or miralax) as needed. . Do not drive or drink alcoholic beverages when taking pain medications.  CALL THE OFFICE FOR: . Temperature above 101 degrees . Excessive bleeding or drainage on the  dressing. . Excessive swelling, coldness, or paleness of the toes. . Persistent nausea and vomiting.  FOLLOW-UP:  . You should have an appointment to return to the office in 2 weeks after surgery. . Arrangements have been made for continuation of Physical Therapy (either home therapy or outpatient therapy).  

## 2018-07-01 NOTE — Evaluation (Signed)
Occupational Therapy Evaluation Patient Details Name: Vanessa Cameron MRN: [id number] DOB: [birth year] Today's Date: 07/01/2018    History of Present Illness 78 y/o female s/p R total hip replacement (posterior approach) 06/30/18.   Clinical Impression    Pt. Is a 78 y.o. female who was admitted to [facility] for a right total hip replacement/post approach with precautions. Pt. was able to state all 3 precautions and was using reacher and sock aid prior to surgery.  She lives at home alone and overall is doing well with mobility per PT evaluation and verbal update with only min guard for ambulation and transfers and minimal pain.  During this session, pt had 9/10 pain and still agreeable to evaluation but c/o spasms and tightness which she did not have yesterday.  She required min assist for use of AD sitting in chair but declined sit to stand to practice simulation over hips due to pain.  She has an elevated toilet seat at home, 2 reachers and a sock aid with a grab bar on the wall by shower.  Rec BSC over toilet but she preferred to use elevated toilet seat. Pt. could benefit from skilled OT services for ADL and A/E retraining, and functional mobility for ADLs in order to improve overall ADL functioning, and return to PLOF.  Pt would like to go to SNF but is doing well with mobility and rec OT HH.    Follow Up Recommendations  Home health OT    Equipment Recommendations       Recommendations for Other Services       Precautions / Restrictions Precautions Precautions: Posterior Hip;Fall Precaution Comments: able to recall all 3 precautions 07-01-18 Restrictions Weight Bearing Restrictions: Yes RLE Weight Bearing: Partial weight bearing      Mobility Bed Mobility Overal bed mobility: Modified Independent Bed Mobility: Supine to Sit     Supine to sit: Min guard     General bed mobility comments: Pt was slow and labored to EOB but did not need direct assist  Transfers Overall transfer  level: Modified independent Equipment used: Rolling walker (2 wheeled) Transfers: Sit to/from Stand Sit to Stand: Min guard         General transfer comment: cuing for set up and sequencing, showed good effort and safety.  Did not have balance/safety issues    Balance Overall balance assessment: Modified Independent                                         ADL either performed or assessed with clinical judgement   ADL Overall ADL's : Needs assistance/impaired Eating/Feeding: Independent;Set up   Grooming: Wash/dry hands;Brushing hair;Set up;Wash/dry face;Oral care;Applying deodorant;Independent   Upper Body Bathing: Independent;Set up   Lower Body Bathing: Minimal assistance;Set up Lower Body Bathing Details (indicate cue type and reason): Pt declined sit to stand for eval due to pain 9/10 but PT reports that she was doing well with min guard assist for mobility. Upper Body Dressing : Independent;Set up   Lower Body Dressing: Minimal assistance;Set up;With adaptive equipment Lower Body Dressing Details (indicate cue type and reason): pt declined sit to stand due to pain but able to push through pain to demonstrate use of reacher and sock aid seated with min cues and assist.                 General ADL Comments: Pt was using  reacher and sock aid prior to surgery due to pain and had minimal pain yesterday and this morning but now has 9/10 pain and NSG present to give pain medication.  Pt lives at home alone and her daughter Vanessa Cameron lives in [place] and was present for eval.     Vision Patient Visual Report: No change from baseline       Perception     Praxis      Pertinent Vitals/Pain Pain Assessment: 0-10 Pain Score: 9  Pain Location: increased pain during session with grimacing and requested pain medications from NSG prior to session. Pain Descriptors / Indicators: Grimacing;Discomfort;Nagging;Spasm;Tightness Pain Intervention(s): Limited  activity within patient's tolerance;Monitored during session;Patient requesting pain meds-RN notified;Repositioned;Relaxation     Hand Dominance Right   Extremity/Trunk Assessment Upper Extremity Assessment Upper Extremity Assessment: Overall WFL for tasks assessed   Lower Extremity Assessment Lower Extremity Assessment: Defer to PT evaluation       Communication Communication Communication: No difficulties   Cognition Arousal/Alertness: Awake/alert Behavior During Therapy: WFL for tasks assessed/performed Overall Cognitive Status: Within Functional Limits for tasks assessed                                 General Comments: daughter Vanessa Cameron present for most of OT session.   General Comments       Exercises Exercises: Total Joint Total Joint Exercises Ankle Circles/Pumps: AROM;10 reps Quad Sets: Strengthening;15 reps Gluteal Sets: Strengthening;15 reps Short Arc Quad: Strengthening;15 reps Heel Slides: Strengthening;10 reps Hip ABduction/ADduction: Strengthening;10 reps Straight Leg Raises: 10 reps;AROM   Shoulder Instructions      Home Living Family/patient expects to be discharged to:: Skilled nursing facility Living Arrangements: Alone                               Additional Comments: Pt does not have steps to enter, one floor, borrowed 2 & 4 WWs      Prior Functioning/Environment Level of Independence: Independent        Comments: Pt normally able to be very active, had recently started to need RW 2/2 hip pain.  Was using reacher and sock aid before surgery.          OT Problem List: Pain;Decreased strength;Decreased activity tolerance      OT Treatment/Interventions: Self-care/ADL training;Balance training;Therapeutic activities;Patient/family education    OT Goals(Current goals can be found in the care plan section) Acute Rehab OT Goals Patient Stated Goal: regain independence OT Goal Formulation: With  patient/family Time For Goal Achievement: 07/15/18 Potential to Achieve Goals: Good ADL Goals Pt Will Perform Lower Body Dressing: with set-up;with adaptive equipment;sit to/from stand;with supervision Pt Will Transfer to Toilet: with set-up;with supervision;bedside commode;regular height toilet Pt Will Perform Toileting - Clothing Manipulation and hygiene: with set-up;with supervision  OT Frequency: Min 2X/week   Barriers to D/C:    lives at home alone       Co-evaluation              AM-PAC OT "6 Clicks" Daily Activity     Outcome Measure Help from another person eating meals?: None Help from another person taking care of personal grooming?: None Help from another person toileting, which includes using toliet, bedpan, or urinal?: A Little Help from another person bathing (including washing, rinsing, drying)?: A Little Help from another person to put on and taking off regular upper body clothing?:  None Help from another person to put on and taking off regular lower body clothing?: A Little 6 Click Score: 21   End of Session Nurse Communication: Patient requests pain meds  Activity Tolerance: Patient tolerated treatment well Patient left: in chair;with call bell/phone within reach;with chair alarm set;with family/visitor present  OT Visit Diagnosis: Unsteadiness on feet (R26.81);Pain;Muscle weakness (generalized) (M62.81) Pain - Right/Left: Right Pain - part of body: Hip                Time: [id number] OT Time Calculation (min): 35 min Charges:  OT General Charges $OT Visit: 1 Visit OT Evaluation $OT Eval Low Complexity: 1 Low OT Treatments $Self Care/Home Management : 8-22 mins  Susanne BordersSusan Nalina Yeatman, OTR/L ascom [phone] 07/01/18, 1:01 PM

## 2018-07-01 NOTE — Discharge Summary (Addendum)
Physician Discharge Summary  Patient ID: Vanessa Cameron MRN: [id number] DOB/AGE: 12/08/1940 78 y.o.  Admit date: 06/30/2018 Discharge date: 07/03/18  Admission Diagnoses:  PRIMARY OSTEOARTHRITIS OF RIGHT HIP  Discharge Diagnoses: Patient Active Problem List   Diagnosis Date Noted  . 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 Primary osteoarthritis of right hip 08/19/2017  . Acute right-sided low back pain with sciatica 06/08/2017  . Right hip pain 06/08/2017  . Anemia 01/09/2014  . Hypertension 01/09/2014  . Primary hyperparathyroidism (HCC) 01/09/2014   Past Medical History:  Diagnosis Date  . Anemia    iron deficiency  . Arthritis   . Arthritis   . Heart murmur    nothing done for this  . History of kidney stones    had once when parathyroid gland was malfunctioning  . History of stomach ulcers   . Hypertension   . Thyroid disease      Transfusion: None.   Consultants (if any):   Discharged Condition: Improved  Hospital Course: Vanessa Cameron is an 78 y.o. female who was admitted 06/30/2018 with a diagnosis of right hip osteoarthritis and went to the operating room on 06/30/2018 and underwent the above named procedures.    Surgeries: Procedure(s): TOTAL HIP ARTHROPLASTY on 06/30/2018 Patient tolerated the surgery well. Taken to PACU where she was stabilized and then transferred to the orthopedic floor.  Started on Lovenox 40mg  q 24 hrs. Foot pumps applied bilaterally at 80 mm. Heels elevated on bed with rolled towels. No evidence of DVT. Negative Homan. Physical therapy started on day #1 for gait training and transfer. OT started day #1 for ADL and assisted devices.  Patient's IV was removed on POD2, Foley removed shortly following surgery.  Implants: Biomet press-fit system with a #12 standard offset reduced proximal profile Echo femoral stem, a 50 mm acetabular shell with an E-poly hi-wall liner, and a 36 mm ceramic head with a +0 mm neck.  She was  given perioperative antibiotics:  Anti-infectives (From admission, onward)   Start     Dose/Rate Route Frequency Ordered Stop   06/30/18 1700  clindamycin (CLEOCIN) IVPB 600 mg     600 mg 100 mL/hr over 30 Minutes Intravenous Every 6 hours 06/30/18 1408 07/01/18 0558   06/30/18 1120  gentamicin (GARAMYCIN) 80 mg in sodium chloride 0.9 % 500 mL irrigation  Status:  Discontinued       As needed 06/30/18 1130 06/30/18 1306   06/30/18 0938  clindamycin (CLEOCIN) 900 MG/50ML IVPB    Note to Pharmacy:  Mardene Celeste   : cabinet override      06/30/18 0938 06/30/18 1053   06/29/18 2330  clindamycin (CLEOCIN) IVPB 900 mg     900 mg 100 mL/hr over 30 Minutes Intravenous  Once 06/29/18 2325 06/30/18 1108    .  She was given sequential compression devices, early ambulation, and Lovenox for DVT prophylaxis.  She benefited maximally from the hospital stay and there were no complications.    Recent vital signs:  Vitals:   07/02/18 1613 07/02/18 2351  BP: 127/68 119/74  Pulse: (!) 110 96  Resp:  16  Temp: 99 F (37.2 C) 98.9 F (37.2 C)  SpO2: 100% 100%    Recent laboratory studies:  Lab Results  Component Value Date   HGB 9.8 (L) 07/02/2018   HGB 9.8 (L) 07/01/2018   HGB 13.2 06/15/2018   Lab Results  Component Value Date   WBC 8.9 07/02/2018   PLT  247 07/02/2018   No results found for: INR Lab Results  Component Value Date   NA 131 (L) 07/03/2018   K 3.9 07/03/2018   CL 99 07/03/2018   CO2 26 07/03/2018   BUN 9 07/03/2018   CREATININE 0.40 (L) 07/03/2018   GLUCOSE 92 07/03/2018    Discharge Medications:   Allergies as of 07/03/2018      Reactions   Penicillins Shortness Of Breath, Itching, Swelling, Other (See Comments)   TONGUE SWELLING DID THE REACTION INVOLVE: Swelling of the face/tongue/throat, SOB, or low BP? Yes Sudden or severe rash/hives, skin peeling, or the inside of the mouth or nose? Yes Did it require medical treatment? Yes When did it last happen? 78  years old If all above answers are "NO", may proceed with cephalosporin use.   Nsaids Other (See Comments)   Had a bleeding ulcer . Corroded stomach lining at site of artery      Medication List    STOP taking these medications   HYDROcodone-acetaminophen 5-325 MG tablet Commonly known as:  NORCO/VICODIN     TAKE these medications   acetaminophen 500 MG tablet Commonly known as:  TYLENOL Take 500-1,000 mg by mouth every 6 (six) hours as needed for moderate pain or headache.   enoxaparin 40 MG/0.4ML injection Commonly known as:  LOVENOX Inject 0.4 mLs (40 mg total) into the skin daily.   ferrous sulfate 325 (65 FE) MG tablet Take 325 mg by mouth daily.   lisinopril 10 MG tablet Commonly known as:  PRINIVIL,ZESTRIL Take 10 mg by mouth daily.   NONFORMULARY OR COMPOUNDED ITEM Estradiol vaginal cream, USP, 0.01% Insert pea sized amount of cream into the vagina on Mon, Wed, Fri. Do not use applicator. What changed:    how much to take  how to take this  when to take this  additional instructions   oxyCODONE 5 MG immediate release tablet Commonly known as:  Oxy IR/ROXICODONE Take 1-2 tablets (5-10 mg total) by mouth every 4 (four) hours as needed for moderate pain (pain score 4-6).   traMADol 50 MG tablet Commonly known as:  ULTRAM Take 1 tablet (50 mg total) by mouth every 6 (six) hours as needed for moderate pain. What changed:    how much to take  when to take this            Durable Medical Equipment  (From admission, onward)         Start     Ordered   06/30/18 1409  DME Bedside commode  Once    Question:  Patient needs a bedside commode to treat with the following condition  Answer:  Status post total hip replacement, right   06/30/18 1408   06/30/18 1409  DME 3 n 1  Once     06/30/18 1408   06/30/18 1409  DME Walker rolling  Once    Question:  Patient needs a walker to treat with the following condition  Answer:  Status post total hip  replacement, right   06/30/18 1408         Diagnostic Studies: Dg Chest 2 View  Result Date: 06/16/2018 CLINICAL DATA:  Preoperative exam hip arthroplasty. EXAM: CHEST - 2 VIEW COMPARISON:  01/25/2009. FINDINGS: Mediastinum and hilar structures normal. Faint questionable nodular densities both mid lungs seen only on PA view and possibly represent overlapping shadows. Repeat PA lateral chest x-ray suggested. If these nodular densities persist nonenhanced chest CT can be obtained to further evaluate. No  pleural effusion or pneumothorax. Heart size normal. Diffuse osteopenia. Degenerative changes and scoliosis thoracic spine. IMPRESSION: Faint questionable nodular densities both mid lung seen only on PA view and possibly representing overlapping shadows. Repeat PA lateral chest x-ray suggested. These nodular densities persist nonenhanced chest CT can be obtained to further evaluate. Electronically Signed   By: Maisie Fus  Register   On: 06/16/2018 06:55   Dg Hip Unilat W Or W/o Pelvis 2-3 Views Right  Result Date: 06/30/2018 CLINICAL DATA:  Status post right total hip replacement. EXAM: DG HIP (WITH OR WITHOUT PELVIS) 2-3V RIGHT COMPARISON:  None. FINDINGS: The femoral and acetabular components appear to be well situated. No fracture or dislocation is noted. IMPRESSION: Status post right total hip arthroplasty. Electronically Signed   By: Lupita Raider, M.D.   On: 06/30/2018 15:14   Disposition: Discharge disposition: 01-Home or Self Care      Plan for possible discharge to SNF on 07/03/18.   Contact information for follow-up providers    Anson [place], PA-C Follow up in 14 day(s).   Specialty:  Physician Assistant Why:  Mindi Slicker information: [address] [place] [address] [phone]            Contact information for after-discharge care    Destination    [facility]erred [facility] .   Service:  Skilled Nursing Contact  information: [address] [place] [address] [phone]                 Signed: Wendall Stade 07/03/2018, 7:35 AM

## 2018-07-01 NOTE — Progress Notes (Signed)
Physical Therapy Treatment Patient Details Name: Vanessa Cameron MRN: [id number] DOB: 09-10-1940 Today's Date: 07/01/2018    History of Present Illness 78 y/o female s/p R total hip replacement (posterior approach) 06/30/18.    PT Comments    Pt continues to be very motivated and eager to work with PT, however she is having a lot more pain and reports increased spasming in quads t/o the day.  Organized PT time with pt getting pain meds however she was still 8/10 at time of session and clearly was more pain limited than session this AM.  She was able to walk nearly as far as this AM but was much slower and more reliant on the walker.  Pt still doing well but is frustrated that she did not make expected gains this afternoon.    Follow Up Recommendations  Follow surgeon's recommendation for DC plan and follow-up therapies     Equipment Recommendations  None recommended by PT    Recommendations for Other Services       Precautions / Restrictions Precautions Precautions: Posterior Hip;Fall Precaution Booklet Issued: Yes (comment) Precaution Comments: able to recall all 3 precautions 07-01-18 Restrictions Weight Bearing Restrictions: Yes RLE Weight Bearing: Weight bearing as tolerated    Mobility  Bed Mobility Overal bed mobility: Needs Assistance Bed Mobility: Sit to Supine       Sit to supine: Min assist   General bed mobility comments: Pt highly motivated to get LEs up into bed, ultimately needed light assist to get R heel up into bed  Transfers Overall transfer level: Modified independent Equipment used: Rolling walker (2 wheeled) Transfers: Sit to/from Stand Sit to Stand: Min guard         General transfer comment: first attempt at standing pt unable to attain full upright and needed to sit back down, further focus on set up and pt able to slowly, but w/o assist get to standing  Ambulation/Gait Ambulation/Gait assistance: Min guard Gait Distance (Feet): 55  Feet Assistive device: Rolling walker (2 wheeled)       General Gait Details: Pt again very motivated to try to do as much as she did this AM, but was much more guarded and slow than this AM's effort.  She had a lot more issues with pain this afternoon and was far more reliant on the walker (unable to maintain consistent walker motion that was relatively easy for her this AM)   Stairs             Wheelchair Mobility    Modified Rankin (Stroke Patients Only)       Balance Overall balance assessment: Modified Independent                                          Cognition Arousal/Alertness: Awake/alert Behavior During Therapy: WFL for tasks assessed/performed Overall Cognitive Status: Within Functional Limits for tasks assessed                                 General Comments: daughter Claris Che present for most of OT session.      Exercises Total Joint Exercises Ankle Circles/Pumps: AROM;10 reps Quad Sets: Strengthening;15 reps Gluteal Sets: Strengthening;15 reps Short Arc Quad: Strengthening;10 reps Heel Slides: Strengthening;10 reps Hip ABduction/ADduction: Strengthening;10 reps    General Comments  Pertinent Vitals/Pain Pain Assessment: 0-10 Pain Score: 8  Pain Location: Pt with considerably more pain than this AM, coordinated PT session well after getting Oxy this afternoon Pain Descriptors / Indicators: Grimacing;Discomfort;Nagging;Spasm;Tightness Pain Intervention(s): Limited activity within patient's tolerance;Monitored during session;Patient requesting pain meds-RN notified;Repositioned;Relaxation    Home Living Family/patient expects to be discharged to:: Skilled nursing facility Living Arrangements: Alone             Additional Comments: Pt does not have steps to enter, one floor, borrowed 2 & 4 WWs    Prior Function Level of Independence: Independent      Comments: Pt normally able to be very active,  had recently started to need RW 2/2 hip pain.  Was using reacher and sock aid before surgery.     PT Goals (current goals can now be found in the care plan section) Acute Rehab PT Goals Patient Stated Goal: regain independence Progress towards PT goals: Not progressing toward goals - comment(pt much more pain limited this afternoon)    Frequency    BID      PT Plan Current plan remains appropriate    Co-evaluation              AM-PAC PT "6 Clicks" Mobility   Outcome Measure  Help needed turning from your back to your side while in a flat bed without using bedrails?: None Help needed moving from lying on your back to sitting on the side of a flat bed without using bedrails?: A Little Help needed moving to and from a bed to a chair (including a wheelchair)?: None Help needed standing up from a chair using your arms (e.g., wheelchair or bedside chair)?: A Little Help needed to walk in hospital room?: None Help needed climbing 3-5 steps with a railing? : A Little 6 Click Score: 21    End of Session Equipment Utilized During Treatment: Gait belt Activity Tolerance: Patient tolerated treatment well Patient left: with bed alarm set;with call bell/phone within reach   PT Visit Diagnosis: Muscle weakness (generalized) (M62.81);Difficulty in walking, not elsewhere classified (R26.2);Pain Pain - Right/Left: Right Pain - part of body: Hip     Time: 1740-8144 PT Time Calculation (min) (ACUTE ONLY): 27 min  Charges:  $Gait Training: 8-22 mins $Therapeutic Exercise: 8-22 mins                     Malachi Pro, DPT 07/01/2018, 3:51 PM

## 2018-07-01 NOTE — Progress Notes (Signed)
Physical Therapy Treatment Patient Details Name: Vanessa Cameron MRN: [id number] DOB: [birth year] Today's Date: 07/01/2018    History of Present Illness 78 y/o female s/p R total hip replacement (posterior approach) 06/30/18.    PT Comments    Pt did well with PT this AM and was able to get to EOB and standing w/o direct assist.  She was also able to ambulate ~60 ft with consistent and safe cadence.  Pt had some mild fatigue and increased pain with activity but showed good strength during lightly resisted R LE bed exercises and she was able to recall 3/3 precautions w/o cuing.     Follow Up Recommendations  Follow surgeon's recommendation for DC plan and follow-up therapies     Equipment Recommendations  None recommended by PT    Recommendations for Other Services       Precautions / Restrictions Precautions Precautions: Posterior Hip;Fall Restrictions RLE Weight Bearing: Partial weight bearing    Mobility  Bed Mobility Overal bed mobility: Modified Independent Bed Mobility: Supine to Sit     Supine to sit: Min guard     General bed mobility comments: Pt was slow and labored to EOB but did not need direct assist  Transfers Overall transfer level: Modified independent Equipment used: Rolling walker (2 wheeled) Transfers: Sit to/from Stand Sit to Stand: Min guard         General transfer comment: cuing for set up and sequencing, showed good effort and safety.  Did not have balance/safety issues  Ambulation/Gait Ambulation/Gait assistance: Min guard Gait Distance (Feet): 60 Feet Assistive device: Rolling walker (2 wheeled)       General Gait Details: Pt was able to ambulate with good confidence and slow but relatively consistent cadence.  She had mild limp on the R but was able to maintain forward walker motion t/o most of the effort.    Stairs             Wheelchair Mobility    Modified Rankin (Stroke Patients Only)       Balance Overall balance  assessment: Modified Independent                                          Cognition Arousal/Alertness: Awake/alert Behavior During Therapy: WFL for tasks assessed/performed Overall Cognitive Status: Within Functional Limits for tasks assessed                                        Exercises Total Joint Exercises Ankle Circles/Pumps: AROM;10 reps Quad Sets: Strengthening;15 reps Gluteal Sets: Strengthening;15 reps Short Arc Quad: Strengthening;15 reps Heel Slides: Strengthening;10 reps Hip ABduction/ADduction: Strengthening;10 reps Straight Leg Raises: 10 reps;AROM    General Comments        Pertinent Vitals/Pain Pain Assessment: 0-10 Pain Score: 4     Home Living                      Prior Function            PT Goals (current goals can now be found in the care plan section) Progress towards PT goals: Progressing toward goals    Frequency    BID      PT Plan Current plan remains appropriate    Co-evaluation  AM-PAC PT "6 Clicks" Mobility   Outcome Measure  Help needed turning from your back to your side while in a flat bed without using bedrails?: None Help needed moving from lying on your back to sitting on the side of a flat bed without using bedrails?: None Help needed moving to and from a bed to a chair (including a wheelchair)?: None Help needed standing up from a chair using your arms (e.g., wheelchair or bedside chair)?: None Help needed to walk in hospital room?: None Help needed climbing 3-5 steps with a railing? : A Little 6 Click Score: 23    End of Session Equipment Utilized During Treatment: Gait belt Activity Tolerance: Patient tolerated treatment well Patient left: with chair alarm set;with call bell/phone within reach;with nursing/sitter in room Nurse Communication: Mobility status PT Visit Diagnosis: Muscle weakness (generalized) (M62.81);Difficulty in walking, not elsewhere  classified (R26.2);Pain Pain - Right/Left: Right Pain - part of body: Hip     Time: 9784-7841 PT Time Calculation (min) (ACUTE ONLY): 25 min  Charges:  $Gait Training: 8-22 mins $Therapeutic Exercise: 8-22 mins                     Malachi Pro, DPT 07/01/2018, 12:02 PM

## 2018-07-01 NOTE — Clinical Social Work Note (Addendum)
Clinical Social Work Assessment  Patient Details  Name: Vanessa Cameron MRN: [id number] Date of Birth: 1941-03-10  Date of referral:  07/01/18               Reason for consult:  Facility Placement                Permission sought to share information with:  Chartered certified accountant granted to share information::  Yes, Verbal Permission Granted  Name::      [place]::   [name]   Relationship::     Contact Information:     Housing/Transportation Living arrangements for the past 2 months:  [name] of Information:  Patient Patient Interpreter Needed:  None Criminal Activity/Legal Involvement Pertinent to Current Situation/Hospitalization:  No - Comment as needed Significant Relationships:  Adult Children Lives with:  Self Do you feel safe going back to the place where you live?  Yes Need for family participation in patient care:  Yes (Comment)  Care giving concerns:  Patient lives alone in [name].    Social Worker assessment / plan:  Holiday representative (CSW) received SNF consult. PT is recommending per surgeon however patient did not do well with PT this afternoon. CSW met with patient alone at bedside to discuss D/C plan. Patient was alert and oriented X4 and was sitting up in the bed. CSW introduced self and explained role of CSW department. Per patient she lives alone in [name] and has 1 adult daughter Vanessa Cameron that lives in [place]. CSW explained that patient is on the borderline for qualifying for SNF. CSW explained that medicare requires a 3 night qualifying inpatient hospital stay in order to pay for SNF. Patient was admitted to inpatient 06/30/2018. Patient verbalized her understanding and prefers to go to SNF. Patient is agreeable to SNF search in [facility]. FL2 complete and faxed out.   CSW presented bed offers to patient and she chose [name] Patient is agreeable to a semi-private room at  [name]. Plan is for patient to D/C to [name] Sunday 07/03/2018 to room 210-B pending medical clearance. [facility] admissions coordinator at [facility] is aware of above. Ortho PA Vanessa Cameron is aware of above.      Employment status:  Disabled (Comment on whether or not currently receiving Disability), Retired Forensic scientist:  Medicare PT Recommendations:  Insurance risk surveyor. ) Information / Referral to community resources:  [place]  Patient/Family's Response to care:  Patient chose [name]   Patient/Family's Understanding of and Emotional Response to Diagnosis, Current Treatment, and Prognosis:  Patient was very pleasant and thanked CSW for assistance.   Emotional Assessment Appearance:  Appears stated age Attitude/Demeanor/Rapport:    Affect (typically observed):  Accepting, Adaptable, Pleasant Orientation:  Oriented to Self, Oriented to Place, Oriented to  Time, Oriented to Situation Alcohol / Substance use:  Not Applicable Psych involvement (Current and /or in the community):  No (Comment)  Discharge Needs  Concerns to be addressed:  Discharge Planning Concerns Readmission within the last 30 days:  No Current discharge risk:  Dependent with Mobility Barriers to Discharge:  Continued Medical Work up   [employer], Vanessa Beets, LCSW 07/01/2018, 3:34 PM

## 2018-07-02 ENCOUNTER — Encounter: Admission: RE | Admit: 2018-07-02 | Payer: Medicare Other | Source: Ambulatory Visit | Admitting: Internal Medicine

## 2018-07-02 LAB — BASIC METABOLIC PANEL
Anion gap: 8 (ref 5–15)
BUN: 8 mg/dL (ref 8–23)
CO2: 26 mmol/L (ref 22–32)
Calcium: 8.4 mg/dL — ABNORMAL LOW (ref 8.9–10.3)
Chloride: 96 mmol/L — ABNORMAL LOW (ref 98–111)
Creatinine, Ser: 0.46 mg/dL (ref 0.44–1.00)
GFR calc Af Amer: >60 mL/min (ref >60)
Glucose, Bld: 104 mg/dL — ABNORMAL HIGH (ref 70–99)
Potassium: 3.8 mmol/L (ref 3.5–5.1)
Sodium: 130 mmol/L — ABNORMAL LOW (ref 135–145)

## 2018-07-02 LAB — CBC
HCT: 28.6 % — ABNORMAL LOW (ref 36.0–46.0)
HEMOGLOBIN: 9.8 g/dL — AB (ref 12.0–15.0)
MCH: 33 pg (ref 26.0–34.0)
MCHC: 34.3 g/dL (ref 30.0–36.0)
MCV: 96.3 fL (ref 80.0–100.0)
Platelets: 247 10*3/uL (ref 150–400)
RBC: 2.97 MIL/uL — ABNORMAL LOW (ref 3.87–5.11)
RDW: 11.5 % (ref 11.5–15.5)
WBC: 8.9 10*3/uL (ref 4.0–10.5)
nRBC: 0 % (ref 0.0–0.2)

## 2018-07-02 NOTE — Progress Notes (Signed)
Physical Therapy Treatment Patient Details Name: Vanessa Cameron MRN: [id number] DOB: 03-20-1941 Today's Date: 07/02/2018    History of Present Illness 78 y/o female s/p R total hip replacement (posterior approach) 06/30/18.    PT Comments    In bed, ready to get up.  Min a for bed mobility, primarily for LE assist.  Stood and was able to ambulate around unit with walker and min guard.  Slow steady gait.  During session pt asking about discharge plan.  Pt scheduled to go to SNF.  Discussed plan with pt and daughter.  Pt is concerned about general mobility but is questioning weather she should return home with daughter vs SNF given her improved mobility today.  Discussed support services including HHPT.  Pt and daughter to discuss among themselves and with surgeon.  She continues to need light assist in and out of bed and general min guard for mobility but if daughter can provide support she would be appropriate for discharge home with her assist if surgeon agrees.  Will continue with current goals and support as needed.  She stated she does not have stairs in/out of house.   Follow Up Recommendations  Follow surgeon's recommendation for DC plan and follow-up therapies     Equipment Recommendations  Rolling walker with 5" wheels    Recommendations for Other Services       Precautions / Restrictions Precautions Precautions: Posterior Hip;Fall Restrictions Weight Bearing Restrictions: Yes RLE Weight Bearing: Weight bearing as tolerated    Mobility  Bed Mobility   Bed Mobility: Supine to Sit     Supine to sit: Min assist     General bed mobility comments: for LE management  Transfers Overall transfer level: Needs assistance Equipment used: Rolling walker (2 wheeled) Transfers: Sit to/from Stand Sit to Stand: Min guard            Ambulation/Gait Ambulation/Gait assistance: Min guard Gait Distance (Feet): 160 Feet Assistive device: Rolling walker (2 wheeled) Gait  Pattern/deviations: Step-through pattern Gait velocity: decreased       Stairs             Wheelchair Mobility    Modified Rankin (Stroke Patients Only)       Balance Overall balance assessment: Needs assistance Sitting-balance support: Feet supported Sitting balance-Leahy Scale: Good     Standing balance support: Bilateral upper extremity supported Standing balance-Leahy Scale: Fair Standing balance comment: heavy reliance on walker with gait.                            Cognition Arousal/Alertness: Awake/alert Behavior During Therapy: WFL for tasks assessed/performed Overall Cognitive Status: Within Functional Limits for tasks assessed                                        Exercises      General Comments        Pertinent Vitals/Pain Pain Assessment: Faces Faces Pain Scale: Hurts a little bit Pain Location: R hip Pain Descriptors / Indicators: Operative site guarding;Sore    Home Living                      Prior Function            PT Goals (current goals can now be found in the care plan section) Progress towards PT goals: Progressing  toward goals    Frequency    BID      PT Plan Current plan remains appropriate    Co-evaluation              AM-PAC PT "6 Clicks" Mobility   Outcome Measure  Help needed turning from your back to your side while in a flat bed without using bedrails?: None Help needed moving from lying on your back to sitting on the side of a flat bed without using bedrails?: A Little Help needed moving to and from a bed to a chair (including a wheelchair)?: A Little Help needed standing up from a chair using your arms (e.g., wheelchair or bedside chair)?: A Little Help needed to walk in hospital room?: A Little Help needed climbing 3-5 steps with a railing? : A Little 6 Click Score: 19    End of Session Equipment Utilized During Treatment: Gait belt Activity Tolerance:  Patient tolerated treatment well Patient left: in chair;with call bell/phone within reach;with chair alarm set;with family/visitor present   Pain - Right/Left: Right Pain - part of body: Hip     Time: 6060-0459 PT Time Calculation (min) (ACUTE ONLY): 14 min  Charges:  $Gait Training: 8-22 mins                     Danielle Dess, PTA 07/02/18, 2:06 PM

## 2018-07-02 NOTE — Clinical Social Work Note (Signed)
CSW was informed by bedside nurse that patient would rather go home with home health tomorrow now, CSW informed case manager.  CSW to update [facility].  Ervin Knack. Cloe Sockwell, MSW, Theresia Majors [phone]  07/02/2018 3:21 PM

## 2018-07-02 NOTE — Progress Notes (Signed)
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    PT Comments    Pt on commode upon arrival.  BM noted and nurse tech notified.  She was able to continue gait around unit and complete ex as below after seated rest. Pain management improved today which allowed for increased gait and mobility.   Follow Up Recommendations  Follow surgeon's recommendation for DC plan and follow-up therapies     Equipment Recommendations  None recommended by PT    Recommendations for Other Services       Precautions / Restrictions Precautions Precautions: Posterior Hip;Fall Restrictions Weight Bearing Restrictions: Yes RLE Weight Bearing: Weight bearing as tolerated    Mobility  Bed Mobility Overal bed mobility: Needs Assistance Bed Mobility: Supine to Sit     Supine to sit: Min assist     General bed mobility comments: for LE management  Transfers Overall transfer level: Needs assistance Equipment used: Rolling walker (2 wheeled) Transfers: Sit to/from Stand Sit to Stand: Min guard            Ambulation/Gait Ambulation/Gait assistance: Min guard Gait Distance (Feet): 160 Feet Assistive device: Rolling walker (2 wheeled) Gait Pattern/deviations: Step-through pattern Gait velocity: decreased   General Gait Details: slow but steady.  Pain controlled today which allowed for increased gait.   Stairs             Wheelchair Mobility    Modified Rankin (Stroke Patients Only)       Balance Overall balance assessment: Needs assistance Sitting-balance support: Feet supported Sitting balance-Leahy Scale: Good     Standing balance support: Bilateral upper extremity supported Standing balance-Leahy Scale: Fair Standing balance comment: heavy reliance on walker with gait.                             Cognition Arousal/Alertness: Awake/alert Behavior During Therapy: WFL for tasks assessed/performed Overall Cognitive Status: Within Functional Limits for tasks assessed                                        Exercises Total Joint Exercises Ankle Circles/Pumps: AROM;10 reps;Standing Hip ABduction/ADduction: 10 reps;Standing;AROM Straight Leg Raises: 10 reps;AROM;Standing Marching in Standing: 10 reps;AROM;Standing Other Exercises Other Exercises: on commode arrival.  BM noted.  nurse tech notified.    General Comments        Pertinent Vitals/Pain Pain Assessment: 0-10 Pain Score: 3  Pain Descriptors / Indicators: Operative site guarding;Sore Pain Intervention(s): Premedicated before session    Home Living                      Prior Function            PT Goals (current goals can now be found in the care plan section) Progress towards PT goals: Progressing toward goals    Frequency    BID      PT Plan Current plan remains appropriate    Co-evaluation              AM-PAC PT "6 Clicks" Mobility   Outcome Measure  Help needed turning from your back to your side while in a flat bed without using bedrails?: None Help needed moving  from lying on your back to sitting on the side of a flat bed without using bedrails?: A Little Help needed moving to and from a bed to a chair (including a wheelchair)?: A Little Help needed standing up from a chair using your arms (e.g., wheelchair or bedside chair)?: A Little Help needed to walk in hospital room?: A Little Help needed climbing 3-5 steps with a railing? : A Little 6 Click Score: 19    End of Session Equipment Utilized During Treatment: Gait belt Activity Tolerance: Patient tolerated treatment well Patient left: in chair;with call bell/phone within reach;with chair alarm set;with family/visitor present Nurse Communication: Other (comment) Pain - Right/Left: Right Pain  - part of body: Hip     Time: [id number] PT Time Calculation (min) (ACUTE ONLY): 21 min  Charges:  $Gait Training: 8-22 mins                     Vanessa Cameron, PTA 07/02/18, 9:44 AM

## 2018-07-02 NOTE — Progress Notes (Signed)
Subjective: 2 Days Post-Op Procedure(s) (LRB): TOTAL HIP ARTHROPLASTY (Right) Patient reports pain as 4 on 0-10 scale.   Patient is well, states that she is much improved  PT and care management to assist with discharge planning. Negative for chest pain and shortness of breath Fever: no Gastrointestinal:Negative for nausea and vomiting  Objective: Vital signs in last 24 hours: Temp:  [97.7 F (36.5 C)-98.2 F (36.8 C)] 97.7 F (36.5 C) (01/31 2321) Pulse Rate:  [85-106] 94 (01/31 2321) Resp:  [16-20] 19 (01/31 2321) BP: (124-149)/(76-95) 124/76 (01/31 2321) SpO2:  [98 %-100 %] 98 % (01/31 2321)  Intake/Output from previous day:  Intake/Output Summary (Last 24 hours) at 07/02/2018 0705 Last data filed at 07/01/2018 1700 Gross per 24 hour  Intake 1187.15 ml  Output -  Net 1187.15 ml    Intake/Output this shift: No intake/output data recorded.  Labs: Recent Labs    07/01/18 0620 07/02/18 0424  HGB 9.8* 9.8*   Recent Labs    07/01/18 0620 07/02/18 0424  WBC 8.5 8.9  RBC 2.94* 2.97*  HCT 28.6* 28.6*  PLT 236 247   Recent Labs    07/01/18 0620 07/02/18 0424  NA 128* 130*  K 4.0 3.8  CL 97* 96*  CO2 25 26  BUN 11 8  CREATININE 0.65 0.46  GLUCOSE 118* 104*  CALCIUM 8.3* 8.4*   No results for input(s): LABPT, INR in the last 72 hours.   EXAM General - Patient is Alert, Appropriate and Oriented Extremity - ABD soft Neurovascular intact Sensation intact distally Intact pulses distally Dorsiflexion/Plantar flexion intact Incision: dressing C/D/I No cellulitis present Dressing/Incision - clean, dry, no drainage Motor Function - intact, moving foot and toes well on exam.  Past Medical History:  Diagnosis Date  . Anemia    iron deficiency  . Arthritis   . Arthritis   . Heart murmur    nothing done for this  . History of kidney stones    had once when parathyroid gland was malfunctioning  . History of stomach ulcers   . Hypertension   . Thyroid  disease    Assessment/Plan: 2 Days Post-Op Procedure(s) (LRB): TOTAL HIP ARTHROPLASTY (Right) Active Problems:   Status post total hip replacement, right  Estimated body mass index is 21.97 kg/m as calculated from the following:   Height as of this encounter: 5\' 4"  (1.626 m).   Weight as of this encounter: 58.1 kg. Advance diet Up with therapy D/C IV fluids when tolerating po intake.  Up with therapy today. Pt needs bowel movement before discharge. Plan for possible discharge tomorrow pending progress with PT. possible discharge to rehab or home.  DVT Prophylaxis - Lovenox, Foot Pumps and TED hose Weight-Bearing as tolerated to right leg  Dedra Skeens PA-C [facility] Orthopaedic Surgery 07/02/2018, 7:05 AM

## 2018-07-02 NOTE — Progress Notes (Signed)
OT Cancellation Note  Patient Details Name: Vanessa Cameron MRN: [id number] DOB: March 16, 1941   Cancelled Treatment: Pt. Education was provided about A/E use for LE ADLs, as well as posterior hip precautions. Pt. pleasantly declined additional practice with the A/E for LE ADLs today. Pt. Plans to go to SNF for continued Therapy.   Olegario Messier, MS, OTR/L 07/02/2018, 1:25 PM

## 2018-07-03 LAB — BASIC METABOLIC PANEL
Anion gap: 6 (ref 5–15)
BUN: 9 mg/dL (ref 8–23)
CO2: 26 mmol/L (ref 22–32)
CREATININE: 0.4 mg/dL — AB (ref 0.44–1.00)
Calcium: 8.3 mg/dL — ABNORMAL LOW (ref 8.9–10.3)
Chloride: 99 mmol/L (ref 98–111)
GFR calc Af Amer: >60 mL/min (ref >60)
GFR calc non Af Amer: >60 mL/min (ref >60)
Glucose, Bld: 92 mg/dL (ref 70–99)
Potassium: 3.9 mmol/L (ref 3.5–5.1)
Sodium: 131 mmol/L — ABNORMAL LOW (ref 135–145)

## 2018-07-03 NOTE — Progress Notes (Signed)
Subjective: 3 Days Post-Op Procedure(s) (LRB): TOTAL HIP ARTHROPLASTY (Right) Patient reports pain as 4 on 0-10 scale.   Patient is well, states that she is much improved  PT and care management to assist with discharge planning. Negative for chest pain and shortness of breath Fever: no Gastrointestinal:Negative for nausea and vomiting  Objective: Vital signs in last 24 hours: Temp:  [98.9 F (37.2 C)-99 F (37.2 C)] 98.9 F (37.2 C) (02/01 2351) Pulse Rate:  [96-110] 96 (02/01 2351) Resp:  [16] 16 (02/01 2351) BP: (119-127)/(68-74) 119/74 (02/01 2351) SpO2:  [100 %] 100 % (02/01 2351)  Intake/Output from previous day:  Intake/Output Summary (Last 24 hours) at 07/03/2018 0733 Last data filed at 07/02/2018 1857 Gross per 24 hour  Intake 720 ml  Output -  Net 720 ml    Intake/Output this shift: No intake/output data recorded.  Labs: Recent Labs    07/01/18 0620 07/02/18 0424  HGB 9.8* 9.8*   Recent Labs    07/01/18 0620 07/02/18 0424  WBC 8.5 8.9  RBC 2.94* 2.97*  HCT 28.6* 28.6*  PLT 236 247   Recent Labs    07/02/18 0424 07/03/18 0341  NA 130* 131*  K 3.8 3.9  CL 96* 99  CO2 26 26  BUN 8 9  CREATININE 0.46 0.40*  GLUCOSE 104* 92  CALCIUM 8.4* 8.3*   No results for input(s): LABPT, INR in the last 72 hours.   EXAM General - Patient is Alert, Appropriate and Oriented Extremity - ABD soft Neurovascular intact Sensation intact distally Intact pulses distally Dorsiflexion/Plantar flexion intact Incision: dressing C/D/I No cellulitis present Dressing/Incision - clean, dry, no drainage with scant drainage Motor Function - intact, moving foot and toes well on exam.  Past Medical History:  Diagnosis Date  . Anemia    iron deficiency  . Arthritis   . Arthritis   . Heart murmur    nothing done for this  . History of kidney stones    had once when parathyroid gland was malfunctioning  . History of stomach ulcers   . Hypertension   . Thyroid  disease    Assessment/Plan: 3 Days Post-Op Procedure(s) (LRB): TOTAL HIP ARTHROPLASTY (Right) Active Problems:   Status post total hip replacement, right  Estimated body mass index is 21.97 kg/m as calculated from the following:   Height as of this encounter: 5\' 4"  (1.626 m).   Weight as of this encounter: 58.1 kg. Advance diet Up with therapy D/C IV fluids when tolerating po intake.  Up with therapy today. Plan for discharge today home with home health physical therapy.  DVT Prophylaxis - Lovenox, Foot Pumps and TED hose Weight-Bearing as tolerated to right leg  Dedra Skeens PA-C [facility] Orthopaedic Surgery 07/03/2018, 7:33 AM

## 2018-07-03 NOTE — Care Management Note (Signed)
Case Management Note  Patient Details  Name: Vanessa Cameron MRN: [id number] Date of Birth: [birth year]  Subjective/Objective:  Patient to be discharged per MD order. Orders in place for home health services. Patient previously planning to go to short term rehab but fortunately she has progressed well and now prefers home health. CMS Medicare.gov Compare Post Acute Care list reviewed with patient and she had previously chosen [facility]. Notified [place] from [place] of referral. PT services needed only. Patient has rolling walker and bedside commode. No futher DME needs. Family to transport.                  Action/Plan:   Expected Discharge Date:  07/03/18               Expected Discharge Plan:     In-House Referral:     Discharge planning Services  CM Consult  Post Acute Care Choice:  Home Health Choice offered to:  Patient  DME Arranged:    DME Agency:     HH Arranged:  PT HH Agency:  [facility]  Status of Service:  Completed, signed off  If discussed at Long Length of Stay Meetings, dates discussed:    Additional Comments:  Virgel Manifold, RN 07/03/2018, 9:04 AM

## 2018-07-03 NOTE — Progress Notes (Signed)
Discharge Note:  Pt given discharge instructions and scripts (lovenox, oxycodone, tramadol). Pt verbalized understanding. Pts surgical dressing changed, honeycomb clean dry and intact. TED hose on both legs. Pt wheeled to car by staff.

## 2018-07-04 LAB — SURGICAL PATHOLOGY

## 2018-07-11 ENCOUNTER — Telehealth: Payer: Self-pay

## 2018-07-11 NOTE — Telephone Encounter (Signed)
EMMI Follow-up: Noted on the report that the patient wasn't sure if she had some new Rx's.  I talked with Vanessa Cameron and she been taking the new Rx's in the hospital so they weren't "new" to her but didn't know how to respond to the automated call.  Said she did get her Rx's filled and said she was doing well.  No needs noted for today.  Thanked me for calling.

## 2018-08-08 ENCOUNTER — Ambulatory Visit: Payer: Self-pay | Admitting: Urology

## 2018-09-05 ENCOUNTER — Ambulatory Visit: Payer: Medicare Other | Admitting: Urology

## 2018-11-07 ENCOUNTER — Ambulatory Visit: Payer: Medicare Other | Admitting: Urology

## 2018-12-04 NOTE — Progress Notes (Signed)
12/05/2018 9:51 AM   [name] Sep 18, 1940 [id number]  Referring provider: Marina GoodellFeldpausch, Dale E, MD [address] [place] [id number]  Chief Complaint  Patient presents with  . Recurrent UTI    69month follow up    HPI: [name] is a 78 -year-old female with a history of rUTI's and vaginal atrophy who presents today for follow up.    History of rUTI's Risk factors: age and  vaginal atrophy.  RUS on 05/03/2018 revealed no hydronephrosis put. Normal renal cortical echotexture.  Subcentimeter simple cyst in the upper pole of the left kidney.  Normal appearing urinary bladder.  Patient denies any gross hematuria, dysuria or suprapubic/flank pain.  Patient denies any fevers, chills, nausea or vomiting.   Vaginal atrophy She is applying vaginal estrogen cream three times weekly.   She has not had any UTI's since starting the cream, but she has found the prescriptions cost prohibitive.   She is receiving the compounded vaginal estrogen cream and that is working well for her.    PMH: Past Medical History:  Diagnosis Date  . Anemia    iron deficiency  . Arthritis   . Arthritis   . Heart murmur    nothing done for this  . History of kidney stones    had once when parathyroid gland was malfunctioning  . History of stomach ulcers   . Hypertension   . Thyroid disease     Surgical History: Past Surgical History:  Procedure Laterality Date  . ABDOMINAL HYSTERECTOMY    . ABDOMINAL HYSTERECTOMY    . PARATHYROIDECTOMY  2011  . THYROIDECTOMY     denies  . TOTAL HIP ARTHROPLASTY Right 06/30/2018   Procedure: TOTAL HIP ARTHROPLASTY;  Surgeon: Christena FlakePoggi, John J, MD;  Location: [facility] ORS;  Service: Orthopedics;  Laterality: Right;    Home Medications:  Allergies as of 12/05/2018      Reactions   Penicillins Shortness Of Breath, Itching, Swelling, Other (See Comments)   TONGUE SWELLING DID THE REACTION INVOLVE: Swelling of the face/tongue/throat, SOB, or low BP? Yes Sudden or  severe rash/hives, skin peeling, or the inside of the mouth or nose? Yes Did it require medical treatment? Yes When did it last happen? 78 years old If all above answers are "NO", may proceed with cephalosporin use.   Nsaids Other (See Comments)   Had a bleeding ulcer . Corroded stomach lining at site of artery      Medication List       Accurate as of December 05, 2018  9:51 AM. If you have any questions, ask your nurse or doctor.        STOP taking these medications   enoxaparin 40 MG/0.4ML injection Commonly known as: LOVENOX Stopped by: Adrean Findlay, PA-C   oxyCODONE 5 MG immediate release tablet Commonly known as: Oxy IR/ROXICODONE Stopped by: Lautaro Koral, PA-C     TAKE these medications   acetaminophen 500 MG tablet Commonly known as: TYLENOL Take 500-1,000 mg by mouth every 6 (six) hours as needed for moderate pain or headache.   ferrous sulfate 325 (65 FE) MG tablet Take 325 mg by mouth daily.   lisinopril 10 MG tablet Commonly known as: ZESTRIL Take 10 mg by mouth daily.   NONFORMULARY OR COMPOUNDED ITEM Estradiol vaginal cream, USP, 0.01% Insert pea sized amount of cream into the vagina on Mon, Wed, Fri. Do not use applicator. What changed:   how much to take  how to take  this  when to take this  additional instructions   traMADol 50 MG tablet Commonly known as: ULTRAM Take 1 tablet (50 mg total) by mouth every 6 (six) hours as needed for moderate pain.       Allergies:  Allergies  Allergen Reactions  . Penicillins Shortness Of Breath, Itching, Swelling and Other (See Comments)    TONGUE SWELLING DID THE REACTION INVOLVE: Swelling of the face/tongue/throat, SOB, or low BP? Yes Sudden or severe rash/hives, skin peeling, or the inside of the mouth or nose? Yes Did it require medical treatment? Yes When did it last happen? 78 years old If all above answers are "NO", may proceed with cephalosporin use.   . Nsaids Other (See Comments)    Had  a bleeding ulcer . Corroded stomach lining at site of artery    Family History: No family history on file.  Social History:  reports that she quit smoking about 20 years ago. Her smoking use included cigarettes. She smoked 0.50 packs per day. She has never used smokeless tobacco. She reports current alcohol use of about 1.0 standard drinks of alcohol per week. She reports that she does not use drugs.  ROS: UROLOGY Frequent Urination?: No Hard to postpone urination?: No Burning/pain with urination?: No Get up at night to urinate?: No Leakage of urine?: No Urine stream starts and stops?: No Trouble starting stream?: No Do you have to strain to urinate?: No Blood in urine?: No Urinary tract infection?: No Sexually transmitted disease?: No Injury to kidneys or bladder?: No Painful intercourse?: No Weak stream?: No Currently pregnant?: No Vaginal bleeding?: No Last menstrual period?: n  Gastrointestinal Nausea?: No Vomiting?: No Indigestion/heartburn?: No Diarrhea?: No Constipation?: No  Constitutional Fever: No Night sweats?: No Weight loss?: No Fatigue?: No  Skin Skin rash/lesions?: No Itching?: No  Eyes Blurred vision?: No Double vision?: No  Ears/Nose/Throat Sore throat?: No Sinus problems?: No  Hematologic/Lymphatic Swollen glands?: No Easy bruising?: No  Cardiovascular Leg swelling?: No Chest pain?: No  Respiratory Cough?: No Shortness of breath?: No  Endocrine Excessive thirst?: No  Musculoskeletal Back pain?: No Joint pain?: No  Neurological Headaches?: No Dizziness?: No  Psychologic Depression?: No Anxiety?: No  Physical Exam: BP 122/67   Pulse 73   Ht 5\' 5"  (1.651 m)   Wt 131 lb (59.4 kg)   BMI 21.80 kg/m   Constitutional:  Well nourished. Alert and oriented, No acute distress. HEENT: [place] AT, moist mucus membranes.  Trachea midline, no masses. Cardiovascular: No clubbing, cyanosis, or edema. Respiratory: Normal respiratory  effort, no increased work of breathing. GI: Abdomen is soft, non tender, non distended, no abdominal masses. Liver and spleen not palpable.  No hernias appreciated.  Stool sample for occult testing is not indicated.   GU: No CVA tenderness.  No bladder fullness or masses.  Atrophic external genitalia, sparse pubic hair distribution, no lesions.  Normal urethral meatus, no lesions, no prolapse, no discharge.   No urethral masses, tenderness and/or tenderness. No bladder fullness, tenderness or masses. Normal vagina mucosa, good estrogen effect, no discharge, no lesions, poor pelvic support, grade III cystocele and grade I rectocele noted.  Cervix, uterus and adnexa are surgically absent.  Anus and perineum are without rashes or lesions.    Neurologic: Grossly intact, no focal deficits, moving all 4 extremities. Psychiatric: Normal mood and affect.   Laboratory Data: Lab Results  Component Value Date   WBC 8.9 07/02/2018   HGB 9.8 (L) 07/02/2018   HCT 28.6 (  L) 07/02/2018   MCV 96.3 07/02/2018   PLT 247 07/02/2018    Lab Results  Component Value Date   CREATININE 0.40 (L) 07/03/2018    No results found for: PSA  No results found for: TESTOSTERONE  No results found for: HGBA1C  No results found for: TSH  No results found for: CHOL, HDL, CHOLHDL, VLDL, LDLCALC  No results found for: AST No results found for: ALT No components found for: ALKALINEPHOPHATASE No components found for: BILIRUBINTOTAL  No results found for: ESTRADIOL  I have reviewed the labs.   Pertinent Imaging: CLINICAL DATA:  recurrent UTI over the past 3 months  EXAM: RENAL / URINARY TRACT ULTRASOUND COMPLETE  COMPARISON:  9.2 x 3.5 x 3.9 cm  FINDINGS: Right Kidney:  Renal measurements: 9.2 x 3.5 x 3.9 cm = volume: 65 mL. Normal renal cortical echotexture. No hydronephrosis.  Left Kidney:  Renal measurements: 11.1 x 3.8 x 4.3 cm corresponding to a volume of 43ml.: The renal cortical  echotexture is similar to that on the right. There is an upper pole simple appearing cortical cyst measuring 8 mm in greatest dimension. There is no hydronephrosis.  Bladder:  Appears normal for degree of bladder distention.  IMPRESSION: No hydronephrosis put. Normal renal cortical echotexture. Subcentimeter simple cyst in the upper pole of the left kidney. Normal appearing urinary bladder.   Electronically Signed   By: David  Martinique M.D.   On: 05/03/2018 14:03    Assessment & Plan:    1. History of recurrent UTI No recent episodes of UTI's over the last year  2. Vaginal atrophy Patient will continue applying the vaginal estrogen cream 3 nights weekly                          Return in about 1 year (around 12/05/2019) for exam .  These notes generated with voice recognition software. I apologize for typographical errors.  Zara Council, PA-C  [facility] [address]  [place], [place] [address] [phone]

## 2018-12-05 ENCOUNTER — Encounter: Payer: Self-pay | Admitting: Urology

## 2018-12-05 ENCOUNTER — Other Ambulatory Visit: Payer: Self-pay

## 2018-12-05 ENCOUNTER — Ambulatory Visit (INDEPENDENT_AMBULATORY_CARE_PROVIDER_SITE_OTHER): Payer: Medicare Other | Admitting: Urology

## 2018-12-05 VITALS — BP 122/67 | HR 73 | Ht 65.0 in | Wt 131.0 lb

## 2018-12-05 DIAGNOSIS — Z87440 Personal history of urinary (tract) infections: Secondary | ICD-10-CM | POA: Diagnosis not present

## 2018-12-05 DIAGNOSIS — N952 Postmenopausal atrophic vaginitis: Secondary | ICD-10-CM

## 2019-03-31 IMAGING — DX DG HIP (WITH OR WITHOUT PELVIS) 2-3V*R*
2 series · 2 of 2 positions shown · non-contrast
Comparison: None.

CLINICAL DATA: Status post right total hip replacement.

EXAM:
DG HIP (WITH OR WITHOUT PELVIS) 2-3V RIGHT

[pelvis ap]
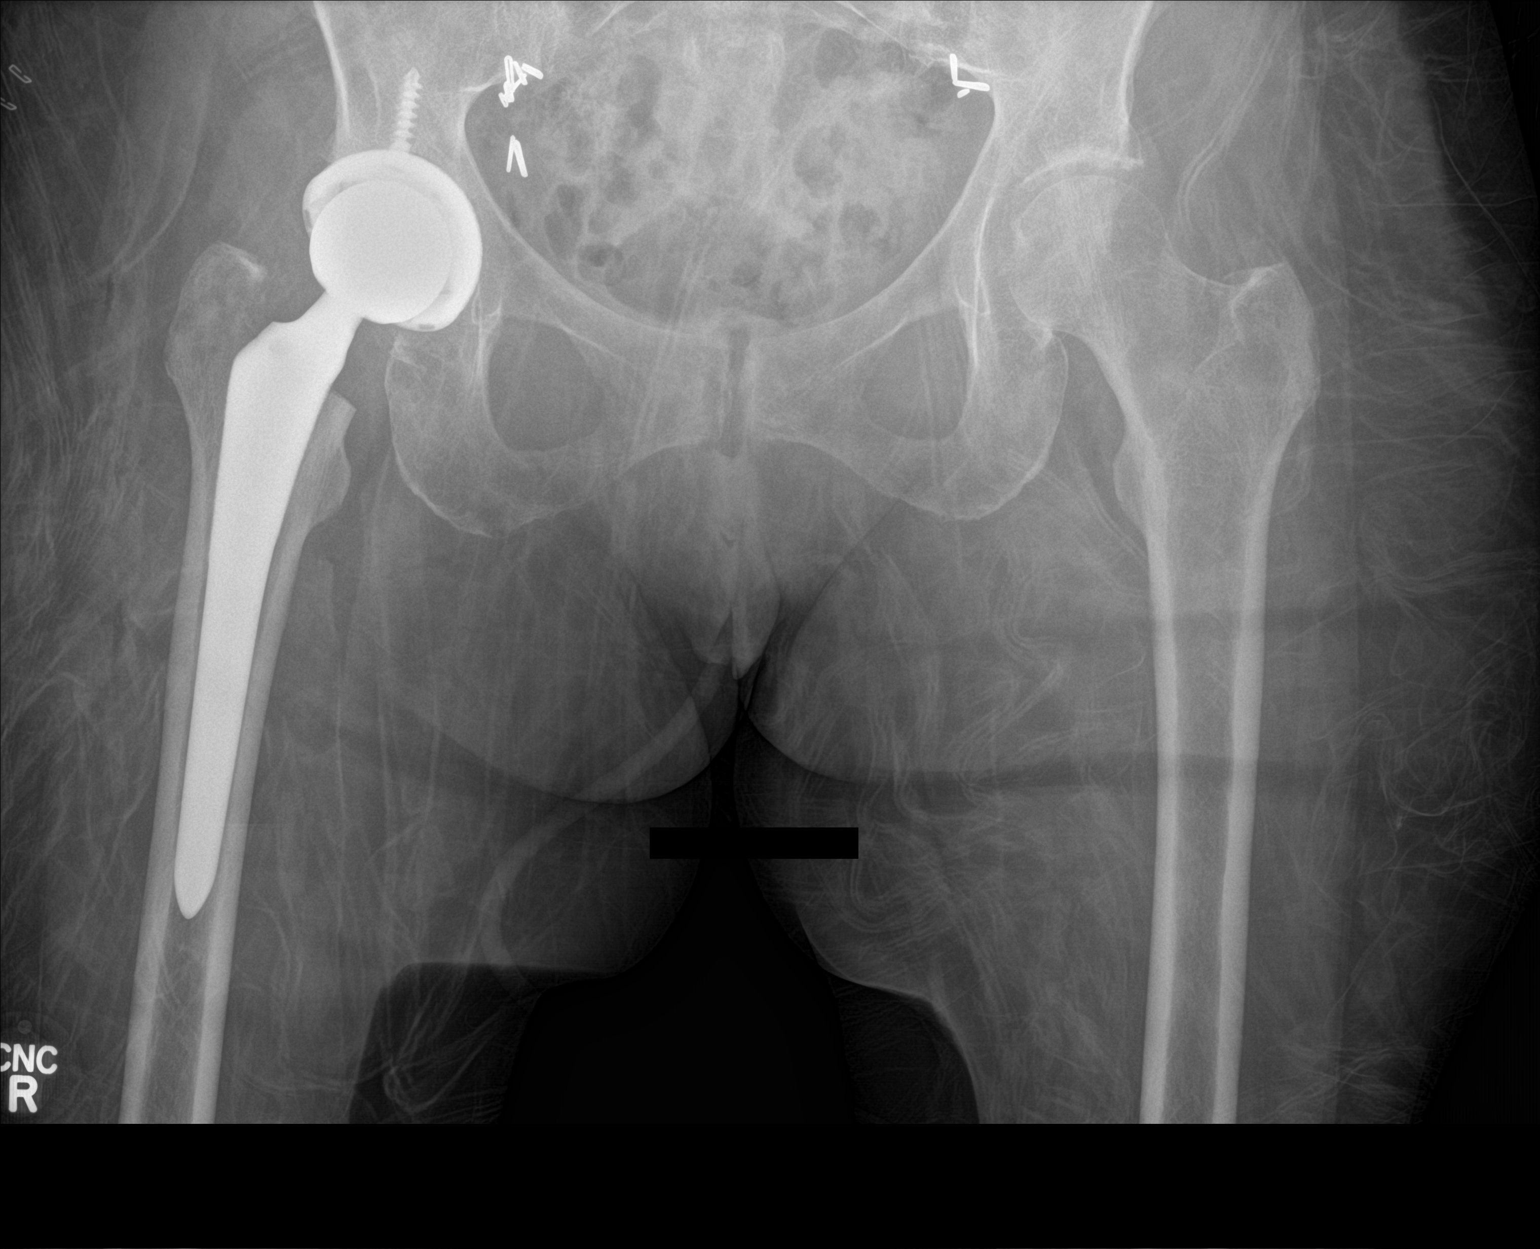

[hip lat]
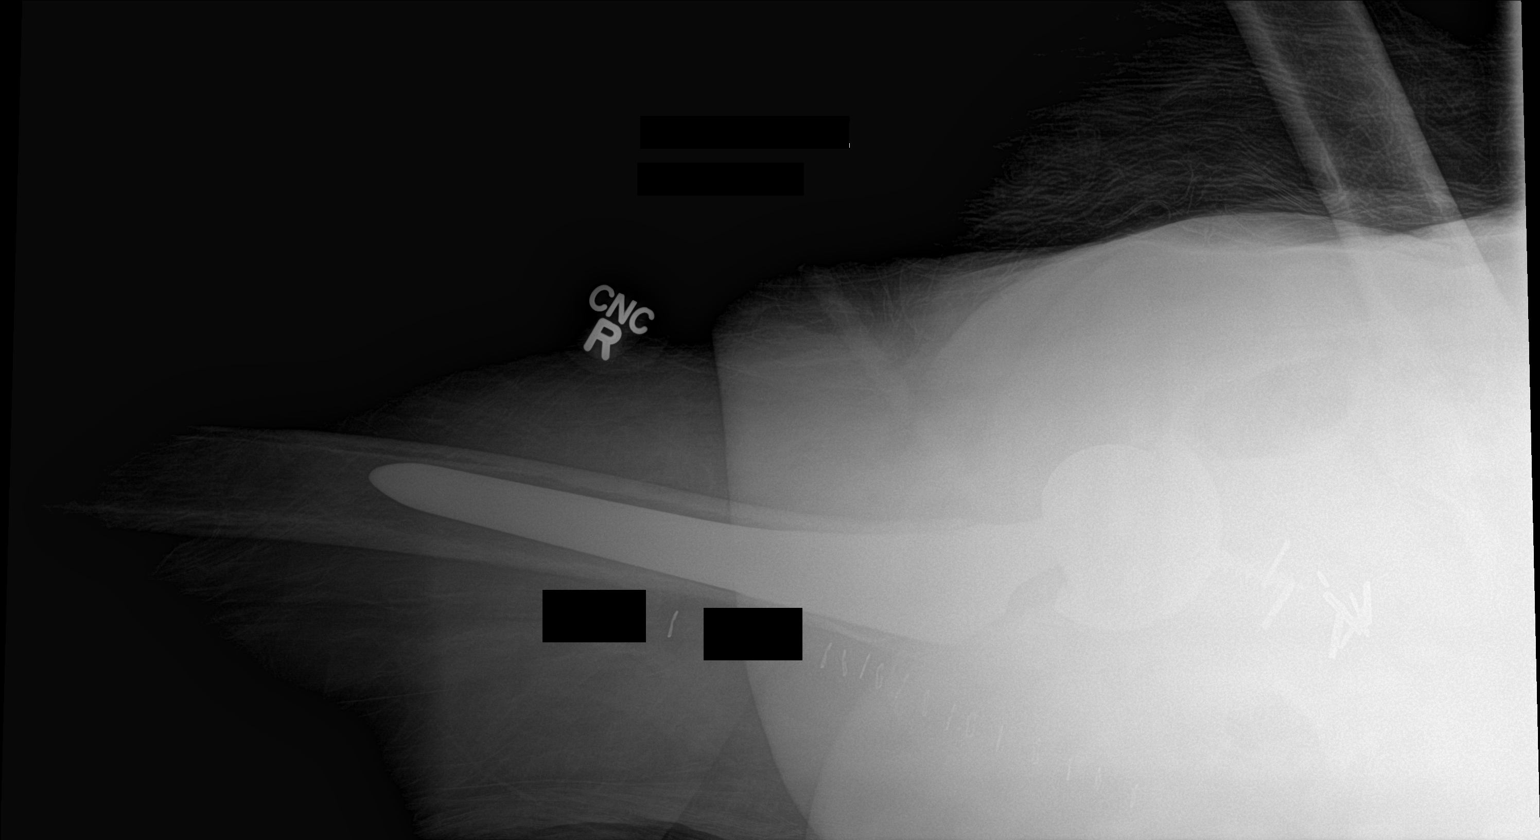

[2 of 2 positions shown; findings below may reference images not displayed]

FINDINGS: The femoral and acetabular components appear to be well situated. No
fracture or dislocation is noted.
IMPRESSION: Status post right total hip arthroplasty.

## 2019-06-18 ENCOUNTER — Other Ambulatory Visit: Payer: Self-pay

## 2019-06-18 ENCOUNTER — Ambulatory Visit
Admission: EM | Admit: 2019-06-18 | Discharge: 2019-06-18 | Disposition: A | Discharge status: Home / Self Care | Payer: Medicare Other | Attending: Family Medicine | Admitting: Family Medicine

## 2019-06-18 DIAGNOSIS — N390 Urinary tract infection, site not specified: Secondary | ICD-10-CM | POA: Diagnosis not present

## 2019-06-18 LAB — URINALYSIS, COMPLETE (UACMP) WITH MICROSCOPIC
Bilirubin Urine: NEGATIVE
Glucose, UA: NEGATIVE mg/dL
Ketones, ur: NEGATIVE mg/dL
Nitrite: POSITIVE — AB
Protein, ur: NEGATIVE mg/dL
Specific Gravity, Urine: 1.015 (ref 1.005–1.030)
pH: 7 (ref 5.0–8.0)

## 2019-06-18 MED ORDER — CIPROFLOXACIN HCL 500 MG PO TABS: 500.0000 mg | ORAL_TABLET | Freq: Two times a day (BID) | ORAL | 0 refills | Status: DC

## 2019-06-18 NOTE — ED Triage Notes (Signed)
Patient complains of urinary urgency, frequency, and burning that started yesterday.

## 2019-06-18 NOTE — ED Provider Notes (Signed)
[facility]    CSN: [id number] Arrival date & time: 06/18/19  0848      History   Chief Complaint Chief Complaint  Patient presents with  . Urinary Urgency    HPI Vanessa Cameron is a 79 y.o. female.   HPI  79 year old female presents with urinary urgency frequency and dysuria described as burning as well in satiety started yesterday.  Denies any vaginal discharge.  She has had no fever or chills and denies any nausea or vomiting.  She has had no backache.  Has had previous UTIs.  States that she was treated with Macrodantin in the past which did not work and had to be switched to [place].  She states that Cipro seems to be the only medication that works for her in the past.  She has an allergy to penicillins and to possible cephalosporins.  She states that the symptoms were worse yesterday and seem to be somewhat better today.  He is afebrile.        Past Medical History:  Diagnosis Date  . Anemia    iron deficiency  . Arthritis   . Arthritis   . Heart murmur    nothing done for this  . History of kidney stones    had once when parathyroid gland was malfunctioning  . History of stomach ulcers   . Hypertension   . Thyroid disease     Patient Active Problem List   Diagnosis Date Noted  . 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 Primary osteoarthritis of right hip 08/19/2017  . Acute right-sided low back pain with sciatica 06/08/2017  . Right hip pain 06/08/2017  . Anemia 01/09/2014  . Hypertension 01/09/2014  . Primary hyperparathyroidism ([place]) 01/09/2014    Past Surgical History:  Procedure Laterality Date  . ABDOMINAL HYSTERECTOMY    . ABDOMINAL HYSTERECTOMY    . PARATHYROIDECTOMY  2011  . THYROIDECTOMY     denies  . TOTAL HIP ARTHROPLASTY Right 06/30/2018   Procedure: TOTAL HIP ARTHROPLASTY;  Surgeon: Corky Mull, MD;  Location: [facility] ORS;  Service: Orthopedics;  Laterality: Right;    OB History   No obstetric history on  file.      Home Medications    Prior to Admission medications   Medication Sig Start Date End Date Taking? Authorizing Provider  ferrous sulfate 325 (65 FE) MG tablet Take 325 mg by mouth daily.    Yes [provider]  lisinopril (PRINIVIL,ZESTRIL) 10 MG tablet Take 10 mg by mouth daily.  11/12/17  Yes [provider]  ciprofloxacin (CIPRO) 500 MG tablet Take 1 tablet (500 mg total) by mouth 2 (two) times daily. 06/18/19   Lorin Picket, PA-C    Family History History reviewed. No pertinent family history.  Social History Social History   Tobacco Use  . Smoking status: Former Smoker    Packs/day: 0.50    Types: Cigarettes    Quit date: 06/01/1998    Years since quitting: 21.0  . Smokeless tobacco: Never Used  . Tobacco comment: quit 30 yrs ago  Substance Use Topics  . Alcohol use: Yes    Alcohol/week: 1.0 standard drinks    Types: 1 Glasses of wine per week  . Drug use: Never     Allergies   Penicillins and Nsaids   Review of Systems Review of Systems  Constitutional: Positive for activity change. Negative for appetite change, chills, fatigue and fever.  Genitourinary: Positive for dysuria, frequency  and urgency. Negative for vaginal discharge.  All other systems reviewed and are negative.    Physical Exam Triage Vital Signs ED Triage Vitals  Enc Vitals Group     BP 06/18/19 0905 (!) 176/93     Pulse Rate 06/18/19 0905 81     Resp 06/18/19 0905 16     Temp 06/18/19 0905 98.3 F (36.8 C)     Temp Source 06/18/19 0905 Oral     SpO2 06/18/19 0905 100 %     Weight 06/18/19 0902 130 lb (59 kg)     Height 06/18/19 0902 5\' 5"  (1.651 m)     Head Circumference --      Peak Flow --      Pain Score 06/18/19 0902 4     Pain Loc --      Pain Edu? --      Excl. in GC? --    No data found.  Updated Vital Signs BP (!) 176/93 (BP Location: Right Arm)   Pulse 81   Temp 98.3 F (36.8 C) (Oral)   Resp 16   Ht 5\' 5"  (1.651 m)   Wt 130 lb (59  kg)   SpO2 100%   BMI 21.63 kg/m   Visual Acuity Right Eye Distance:   Left Eye Distance:   Bilateral Distance:    Right Eye Near:   Left Eye Near:    Bilateral Near:     Physical Exam Vitals and nursing note reviewed.  Constitutional:      General: She is not in acute distress.    Appearance: Normal appearance. She is normal weight. She is not ill-appearing, toxic-appearing or diaphoretic.  HENT:     Head: Normocephalic and atraumatic.  Eyes:     Conjunctiva/sclera: Conjunctivae normal.  Cardiovascular:     Rate and Rhythm: Normal rate and regular rhythm.     Heart sounds: Normal heart sounds.  Pulmonary:     Effort: Pulmonary effort is normal.     Breath sounds: Normal breath sounds.  Abdominal:     General: Abdomen is flat. Bowel sounds are normal. There is no distension.     Palpations: Abdomen is soft.     Tenderness: There is no abdominal tenderness. There is no right CVA tenderness, left CVA tenderness, guarding or rebound.  Musculoskeletal:        General: Normal range of motion.     Cervical back: Normal range of motion and neck supple.  Skin:    General: Skin is warm and dry.  Neurological:     General: No focal deficit present.     Mental Status: She is alert and oriented to person, place, and time.  Psychiatric:        Mood and Affect: Mood normal.        Behavior: Behavior normal.        Thought Content: Thought content normal.        Judgment: Judgment normal.      UC Treatments / Results  Labs (all labs ordered are listed, but only abnormal results are displayed) Labs Reviewed  URINALYSIS, COMPLETE (UACMP) WITH MICROSCOPIC - Abnormal; Notable for the following components:      Result Value   Hgb urine dipstick TRACE (*)    Nitrite POSITIVE (*)    Leukocytes,Ua SMALL (*)    Bacteria, UA FEW (*)    All other components within normal limits  URINE CULTURE    EKG   Radiology No results found.  Procedures Procedures (including critical  care time)  Medications Ordered in UC Medications - No data to display  Initial Impression / Assessment and Plan / UC Course  I have reviewed the triage vital signs and the nursing notes.  Pertinent labs & imaging results that were available during my care of the patient were reviewed by me and considered in my medical decision making (see chart for details).   79 year old female presents with 1 day history of dysuria described as burning urgency frequency and in satiety.  Laboratory is consistent with a UTI.  Will obtain urine cultures for confirmation and sensitivities.  In discussion with the patient she had been prescribed Macrodantin in the past which was ineffective against her previous UTI.  She is requesting the use of Cipro which seem to be the only medication that seemed to work for her.  She has an allergy to penicillins and possibly cephalosporins.  I will start her on Cipro 500 mg twice daily for 5 days.  The results of the cultures for urine should be available in 2 days at which time we may switch the antibiotic.   Final Clinical Impressions(s) / UC Diagnoses   Final diagnoses:  Acute lower UTI (urinary tract infection)   Discharge Instructions   None    ED Prescriptions    Medication Sig Dispense Auth. Provider   ciprofloxacin (CIPRO) 500 MG tablet Take 1 tablet (500 mg total) by mouth 2 (two) times daily. 5 tablet Lutricia Feil, PA-C     PDMP not reviewed this encounter.   Lutricia Feil, PA-C 06/18/19 1021

## 2019-06-20 ENCOUNTER — Telehealth (HOSPITAL_COMMUNITY): Payer: Self-pay | Admitting: Emergency Medicine

## 2019-06-20 LAB — URINE CULTURE: Culture: >=100000 — AB

## 2019-06-20 MED ORDER — NITROFURANTOIN MONOHYD MACRO 100 MG PO CAPS: 100.0000 mg | ORAL_CAPSULE | Freq: Two times a day (BID) | ORAL | 0 refills | Status: AC

## 2019-06-20 NOTE — Telephone Encounter (Signed)
Urine culture treated with cipro, per Dr. Leonides Grills pt needs macrobid bid x7 days. Patient contacted by phone and made aware of    results. Pt verbalized understanding and had all questions answered.

## 2019-09-12 ENCOUNTER — Other Ambulatory Visit: Payer: Self-pay

## 2019-09-12 ENCOUNTER — Other Ambulatory Visit
Admission: RE | Admit: 2019-09-12 | Discharge: 2019-09-12 | Disposition: A | Discharge status: Home / Self Care | Payer: Medicare Other | Attending: Urology | Admitting: Urology

## 2019-09-12 ENCOUNTER — Telehealth: Payer: Self-pay | Admitting: Urology

## 2019-09-12 DIAGNOSIS — N390 Urinary tract infection, site not specified: Secondary | ICD-10-CM | POA: Diagnosis present

## 2019-09-12 LAB — URINALYSIS, COMPLETE (UACMP) WITH MICROSCOPIC
Bacteria, UA: NONE SEEN
Bilirubin Urine: NEGATIVE
Glucose, UA: NEGATIVE mg/dL
Ketones, ur: NEGATIVE mg/dL
Nitrite: NEGATIVE
Protein, ur: NEGATIVE mg/dL
Specific Gravity, Urine: 1.015 (ref 1.005–1.030)
pH: 7 (ref 5.0–8.0)

## 2019-09-12 NOTE — Telephone Encounter (Signed)
Pt called office today and feels like she has a UTI.  She is having frequency, urgency and burning.  She will go to [facility] location today and leave urine sample, per Carollee Herter.

## 2019-09-14 NOTE — Telephone Encounter (Signed)
Pt left urine sample in [place] on Tuesday 4/13 and isn't feeling any better.  She hasn't heard from anyone and wanted to know if we have any results yet.

## 2019-09-14 NOTE — Telephone Encounter (Signed)
Spoke to patient and informed her that the culture result is not in yet and we will call her as soon as we have a result. She states she is taking AZO and it does help some. She will continue that medication until she hears further from [place].

## 2019-09-15 ENCOUNTER — Telehealth: Payer: Self-pay | Admitting: Family Medicine

## 2019-09-15 LAB — URINE CULTURE: Culture: 80000 — AB

## 2019-09-15 MED ORDER — NITROFURANTOIN MONOHYD MACRO 100 MG PO CAPS: 100.0000 mg | ORAL_CAPSULE | Freq: Two times a day (BID) | ORAL | 0 refills | Status: DC

## 2019-09-15 NOTE — Telephone Encounter (Signed)
Patient notified and voiced understanding.

## 2019-09-15 NOTE — Telephone Encounter (Signed)
-----   Message from Harle Battiest, PA-C sent at 09/15/2019  8:19 AM EDT ----- Please let Vanessa Cameron know that her urine culture is positive for infection and I would like her to start Macrobid 100 mg, BID x 7 days.

## 2019-12-11 ENCOUNTER — Ambulatory Visit: Payer: Medicare Other | Admitting: Urology

## 2019-12-15 ENCOUNTER — Other Ambulatory Visit: Payer: Self-pay | Admitting: *Deleted

## 2019-12-15 DIAGNOSIS — N390 Urinary tract infection, site not specified: Secondary | ICD-10-CM

## 2019-12-17 NOTE — Progress Notes (Signed)
12/18/2019 9:38 AM   Cira Rue 1941-04-07 [id number]  Referring provider: Marina Goodell, MD [address] [place],  [place] [address]  Chief Complaint  Patient presents with  . Follow-up    1 year follow-up    HPI: Vanessa Cameron is a 79 -year-old female with a history of rUTI's and vaginal atrophy who presents today for follow up.    rUTI's Risk factors: age and  vaginal atrophy.  RUS on 05/03/2018 revealed no hydronephrosis put. Normal renal cortical echotexture.  Subcentimeter simple cyst in the upper pole of the left kidney.  Normal appearing urinary bladder.   She has had two UTI's positive for enterococcus faecalis over the last year.    Vaginal atrophy She is applying the vaginal estrogen cream 3 nights weekly.  We have been calling in the compounded estrogen for her as the prescription vaginal estrogen cream is cost prohibitive.    PMH: Past Medical History:  Diagnosis Date  . Anemia    iron deficiency  . Arthritis   . Arthritis   . Heart murmur    nothing done for this  . History of kidney stones    had once when parathyroid gland was malfunctioning  . History of stomach ulcers   . Hypertension   . Thyroid disease     Surgical History: Past Surgical History:  Procedure Laterality Date  . ABDOMINAL HYSTERECTOMY    . ABDOMINAL HYSTERECTOMY    . PARATHYROIDECTOMY  2011  . THYROIDECTOMY     denies  . TOTAL HIP ARTHROPLASTY Right 06/30/2018   Procedure: TOTAL HIP ARTHROPLASTY;  Surgeon: Christena Flake, MD;  Location: [facility] ORS;  Service: Orthopedics;  Laterality: Right;    Home Medications:  Allergies as of 12/18/2019      Reactions   Penicillins Shortness Of Breath, Itching, Swelling, Other (See Comments)   TONGUE SWELLING DID THE REACTION INVOLVE: Swelling of the face/tongue/throat, SOB, or low BP? Yes Sudden or severe rash/hives, skin peeling, or the inside of the mouth or nose? Yes Did it require medical treatment? Yes When did it last happen?  79 years old If all above answers are "NO", may proceed with cephalosporin use.   Nsaids Other (See Comments)   Had a bleeding ulcer . Corroded stomach lining at site of artery      Medication List       Accurate as of December 18, 2019  9:38 AM. If you have any questions, ask your nurse or doctor.        STOP taking these medications   ciprofloxacin 500 MG tablet Commonly known as: CIPRO Stopped by: Raylie Maddison, PA-C   nitrofurantoin (macrocrystal-monohydrate) 100 MG capsule Commonly known as: MACROBID Stopped by: Luisalberto Beegle, PA-C     TAKE these medications   ferrous sulfate 325 (65 FE) MG tablet Take 325 mg by mouth daily.   lisinopril 10 MG tablet Commonly known as: ZESTRIL Take 10 mg by mouth daily.   traMADol 50 MG tablet Commonly known as: ULTRAM Take 1-2 tablets by mouth 2 (two) times daily as needed.       Allergies:  Allergies  Allergen Reactions  . Penicillins Shortness Of Breath, Itching, Swelling and Other (See Comments)    TONGUE SWELLING DID THE REACTION INVOLVE: Swelling of the face/tongue/throat, SOB, or low BP? Yes Sudden or severe rash/hives, skin peeling, or the inside of the mouth or nose? Yes Did it require medical treatment? Yes When did it last happen? 79  years old If all above answers are "NO", may proceed with cephalosporin use.   . Nsaids Other (See Comments)    Had a bleeding ulcer . Corroded stomach lining at site of artery    Family History: No family history on file.  Social History:  reports that she quit smoking about 21 years ago. Her smoking use included cigarettes. She smoked 0.50 packs per day. She has never used smokeless tobacco. She reports current alcohol use of about 1.0 standard drink of alcohol per week. She reports that she does not use drugs.  ROS: For pertinent review of systems please refer to history of present illness  Physical Exam: BP (!) 184/90   Pulse 72   Ht 5\' 4"  (1.626 m)   Wt 129 lb (58.5  kg)   BMI 22.14 kg/m   Constitutional:  Well nourished. Alert and oriented, No acute distress. HEENT: [place] AT, mask in place.  Trachea midline Cardiovascular: No clubbing, cyanosis, or edema. Respiratory: Normal respiratory effort, no increased work of breathing. GI: Abdomen is soft, non tender, non distended, no abdominal masses. Liver and spleen not palpable.  No hernias appreciated.  Stool sample for occult testing is not indicated.   GU: No CVA tenderness.  No bladder fullness or masses.  Atrophic external genitalia, sparse pubic hair distribution, no lesions.  Normal urethral meatus, no lesions, no prolapse, no discharge.   No urethral masses, tenderness and/or tenderness. No bladder fullness, tenderness or masses. Pale vagina mucosa, good estrogen effect, no discharge, no lesions, fair pelvic support, grade II cystocele and no rectocele noted.  Cervix, uterus and adnexa are surgically absent.   Anus and perineum are without rashes or lesions.    Skin: No rashes, bruises or suspicious lesions. Lymph: No inguinal adenopathy. Neurologic: Grossly intact, no focal deficits, moving all 4 extremities. Psychiatric: Normal mood and affect.   Laboratory Data: Lab Results  Component Value Date   WBC 8.9 07/02/2018   HGB 9.8 (L) 07/02/2018   HCT 28.6 (L) 07/02/2018   MCV 96.3 07/02/2018   PLT 247 07/02/2018    Lab Results  Component Value Date   CREATININE 0.40 (L) 07/03/2018   Component     Latest Ref Rng & Units 12/18/2019  Color, Urine     YELLOW STRAW (A)  Appearance     CLEAR CLEAR  Specific Gravity, Urine     1.005 - 1.030 1.010  pH     5.0 - 8.0 6.5  Glucose, UA     NEGATIVE mg/dL NEGATIVE  Hgb urine dipstick     NEGATIVE TRACE (A)  Bilirubin Urine     NEGATIVE NEGATIVE  Ketones, ur     NEGATIVE mg/dL NEGATIVE  Protein     NEGATIVE mg/dL NEGATIVE  Nitrite     NEGATIVE NEGATIVE  Leukocytes,Ua     NEGATIVE NEGATIVE  Squamous Epithelial / LPF     0 - 5 0-5  WBC, UA      0 - 5 WBC/hpf 0-5  RBC / HPF     0 - 5 RBC/hpf 0-5  Bacteria, UA     NONE SEEN NONE SEEN   I have reviewed the labs.   Pertinent Imaging: CLINICAL DATA:  recurrent UTI over the past 3 months  EXAM: RENAL / URINARY TRACT ULTRASOUND COMPLETE  COMPARISON:  9.2 x 3.5 x 3.9 cm  FINDINGS: Right Kidney:  Renal measurements: 9.2 x 3.5 x 3.9 cm = volume: 65 mL. Normal renal cortical echotexture. No hydronephrosis.  Left Kidney:  Renal measurements: 11.1 x 3.8 x 4.3 cm corresponding to a volume of 47ml.: The renal cortical echotexture is similar to that on the right. There is an upper pole simple appearing cortical cyst measuring 8 mm in greatest dimension. There is no hydronephrosis.  Bladder:  Appears normal for degree of bladder distention.  IMPRESSION: No hydronephrosis put. Normal renal cortical echotexture. Subcentimeter simple cyst in the upper pole of the left kidney. Normal appearing urinary bladder.   Electronically Signed   By: David  [place] M.D.   On: 05/03/2018 14:03    Assessment & Plan:    1. rUTI's Two UTI's over the last year Continue with conservative measures  2. Vaginal atrophy Patient will continue applying the vaginal estrogen cream 3 nights weekly Prescription called to [facility]                      Return in about 1 year (around 12/17/2020) for exam and UA .  These notes generated with voice recognition software. I apologize for typographical errors.  Michiel Cowboy, PA-C  [facility] [address]  [address] [place], [place] [address] [phone]

## 2019-12-18 ENCOUNTER — Other Ambulatory Visit: Payer: Self-pay

## 2019-12-18 ENCOUNTER — Encounter: Payer: Self-pay | Admitting: Urology

## 2019-12-18 ENCOUNTER — Ambulatory Visit (INDEPENDENT_AMBULATORY_CARE_PROVIDER_SITE_OTHER): Payer: Medicare Other | Admitting: Urology

## 2019-12-18 ENCOUNTER — Other Ambulatory Visit
Admission: RE | Admit: 2019-12-18 | Discharge: 2019-12-18 | Disposition: A | Discharge status: Home / Self Care | Payer: Medicare Other | Attending: Urology | Admitting: Urology

## 2019-12-18 VITALS — BP 184/90 | HR 72 | Ht 64.0 in | Wt 129.0 lb

## 2019-12-18 DIAGNOSIS — N952 Postmenopausal atrophic vaginitis: Secondary | ICD-10-CM

## 2019-12-18 DIAGNOSIS — N390 Urinary tract infection, site not specified: Secondary | ICD-10-CM

## 2019-12-18 LAB — URINALYSIS, COMPLETE (UACMP) WITH MICROSCOPIC
Bacteria, UA: NONE SEEN
Bilirubin Urine: NEGATIVE
Glucose, UA: NEGATIVE mg/dL
Ketones, ur: NEGATIVE mg/dL
Leukocytes,Ua: NEGATIVE
Nitrite: NEGATIVE
Protein, ur: NEGATIVE mg/dL
Specific Gravity, Urine: 1.01 (ref 1.005–1.030)
pH: 6.5 (ref 5.0–8.0)

## 2019-12-18 MED ORDER — ESTRADIOL 0.1 MG/GM VA CREA: TOPICAL_CREAM | VAGINAL | 12 refills | Status: DC

## 2020-05-28 ENCOUNTER — Ambulatory Visit (INDEPENDENT_AMBULATORY_CARE_PROVIDER_SITE_OTHER): Payer: Medicare Other | Admitting: Urology

## 2020-05-28 ENCOUNTER — Other Ambulatory Visit
Admission: RE | Admit: 2020-05-28 | Discharge: 2020-05-28 | Disposition: A | Discharge status: Home / Self Care | Payer: Medicare Other | Attending: Urology | Admitting: Urology

## 2020-05-28 ENCOUNTER — Other Ambulatory Visit: Payer: Self-pay

## 2020-05-28 ENCOUNTER — Encounter: Payer: Self-pay | Admitting: Urology

## 2020-05-28 VITALS — BP 175/85 | HR 68 | Ht 64.0 in | Wt 130.0 lb

## 2020-05-28 DIAGNOSIS — N390 Urinary tract infection, site not specified: Secondary | ICD-10-CM | POA: Diagnosis present

## 2020-05-28 DIAGNOSIS — N3000 Acute cystitis without hematuria: Secondary | ICD-10-CM | POA: Diagnosis not present

## 2020-05-28 LAB — URINALYSIS, COMPLETE (UACMP) WITH MICROSCOPIC
Bilirubin Urine: NEGATIVE
Glucose, UA: NEGATIVE mg/dL
Ketones, ur: NEGATIVE mg/dL
Nitrite: POSITIVE — AB
Protein, ur: NEGATIVE mg/dL
Specific Gravity, Urine: 1.01 (ref 1.005–1.030)
WBC, UA: >50 WBC/hpf (ref 0–5)
pH: 7 (ref 5.0–8.0)

## 2020-05-28 MED ORDER — NITROFURANTOIN MONOHYD MACRO 100 MG PO CAPS: 100.0000 mg | ORAL_CAPSULE | Freq: Two times a day (BID) | ORAL | 0 refills | Status: DC

## 2020-05-28 NOTE — Progress Notes (Signed)
   05/28/2020 11:13 AM   Cira Rue 19-May-1941 [id number]  Reason for visit: Acute UTI  HPI: I saw Vanessa Cameron in urology clinic today for evaluation of UTI symptoms.  She is a 79 year old female who is regularly followed by our PA Michiel Cowboy for vaginal atrophy and recurrent UTIs, and is on topical estrogen cream.  She reports 1-2 days of severe dysuria, urgency and frequency consistent with her typical UTI symptoms.  She started Pyridium this morning.  She denies any fevers, chills, or flank pain.  Urinalysis today concerning for infection with many bacteria, WBC clumps, 0-5 RBCs, greater than 50 WBCs, trace leukocytes, nitrite positive.  Will send for culture.  We reviewed return precautions.  Nitrofurantoin 100 mg twice daily x5 days, follow-up culture results Keep 19-month routine follow-up with Michiel Cowboy, PA  Sondra Come, MD  [facility] [address], [address] [place], [place] [address] [phone]

## 2020-05-31 LAB — URINE CULTURE: Culture: >=100000 — AB

## 2020-12-14 ENCOUNTER — Encounter: Payer: Self-pay | Admitting: Emergency Medicine

## 2020-12-14 ENCOUNTER — Ambulatory Visit
Admission: EM | Admit: 2020-12-14 | Discharge: 2020-12-14 | Disposition: A | Discharge status: Home / Self Care | Payer: Medicare Other | Attending: Family Medicine | Admitting: Family Medicine

## 2020-12-14 ENCOUNTER — Other Ambulatory Visit: Payer: Self-pay

## 2020-12-14 DIAGNOSIS — N3001 Acute cystitis with hematuria: Secondary | ICD-10-CM

## 2020-12-14 LAB — POCT URINALYSIS DIP (DEVICE)
Bilirubin Urine: NEGATIVE
Glucose, UA: NEGATIVE mg/dL
Ketones, ur: NEGATIVE mg/dL
Nitrite: POSITIVE — AB
Protein, ur: NEGATIVE mg/dL
Specific Gravity, Urine: 1.01 (ref 1.005–1.030)
Urobilinogen, UA: 0.2 mg/dL (ref 0.0–1.0)
pH: 7 (ref 5.0–8.0)

## 2020-12-14 MED ORDER — NITROFURANTOIN MONOHYD MACRO 100 MG PO CAPS: 100.0000 mg | ORAL_CAPSULE | Freq: Two times a day (BID) | ORAL | 0 refills | Status: DC

## 2020-12-14 NOTE — ED Provider Notes (Signed)
[facility]    CSN: [id number] Arrival date & time: 12/14/20  2683      History   Chief Complaint Chief Complaint  Patient presents with   Dysuria   Back Pain    HPI 80 year old female presents with concerns for UTI.  Patient states that her symptoms began last night.  She reports frequency, urgency, and dysuria.  She reports some associated back pain.  Denies flank pain.  Denies abdominal pain.  No fever.  No relieving factors.  Pain 8/10 in severity.  Has a history of recurrent UTI.  Last UTI grew E. coli and was pansensitive.   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   Primary osteoarthritis of right hip 08/19/2017   Acute right-sided low back pain with sciatica 06/08/2017   Right hip pain 06/08/2017   Anemia 01/09/2014   Hypertension 01/09/2014   Primary hyperparathyroidism (HCC) 01/09/2014    Past Surgical History:  Procedure Laterality Date   ABDOMINAL HYSTERECTOMY     ABDOMINAL HYSTERECTOMY     PARATHYROIDECTOMY  2011   THYROIDECTOMY     denies   TOTAL HIP ARTHROPLASTY Right 06/30/2018   Procedure: TOTAL HIP ARTHROPLASTY;  Surgeon: Christena Flake, MD;  Location: [facility] ORS;  Service: Orthopedics;  Laterality: Right;    OB History   No obstetric history on file.      Home Medications    Prior to Admission medications   Medication Sig Start Date End Date Taking? Authorizing Provider  estradiol (ESTRACE) 0.1 MG/GM vaginal cream Compounded cream  No applicator: use pea sized amount on the tip of finger before bed Monday, Wednesday, and Fridays 12/18/19  Yes McGowan, Carollee Herter A, PA-C  ferrous  sulfate 325 (65 FE) MG tablet Take 325 mg by mouth daily.    Yes [provider]  lisinopril (PRINIVIL,ZESTRIL) 10 MG tablet Take 10 mg by mouth daily.  11/12/17  Yes [provider]  traMADol (ULTRAM) 50 MG tablet Take 0.5-1 tablets by mouth 2 (two) times daily as needed. 1/2 - 1 tablet as needed 10/31/19  Yes [provider]  nitrofurantoin, macrocrystal-monohydrate, (MACROBID) 100 MG capsule Take 1 capsule (100 mg total) by mouth every 12 (twelve) hours. 12/14/20   Tommie Sams, DO   Social History Social History   Tobacco Use   Smoking status: Former    Packs/day: 0.50    Types: Cigarettes    Quit date: 06/01/1998    Years since quitting: 22.5   Smokeless tobacco: Never   Tobacco comments:    quit 30 yrs ago  Vaping Use   Vaping Use: Never used  Substance Use Topics   Alcohol use: Yes    Alcohol/week: 1.0 standard drink    Types: 1 Glasses of wine per week   Drug use: Never     Allergies   Penicillins and Nsaids   Review of Systems Review of Systems  Constitutional: Negative.   Genitourinary:  Positive for dysuria, frequency and urgency.    Physical Exam Triage Vital Signs  ED Triage Vitals  Enc Vitals Group     BP 12/14/20 0835 (!) 186/79     Pulse Rate 12/14/20 0835 76     Resp 12/14/20 0835 14     Temp 12/14/20 0835 98 F (36.7 C)     Temp Source 12/14/20 0835 Oral     SpO2 12/14/20 0835 100 %     Weight 12/14/20 0833 130 lb (59 kg)     Height 12/14/20 0833 5\' 4"  (1.626 m)     Head Circumference --      Peak Flow --      Pain Score 12/14/20 0832 8     Pain Loc --      Pain Edu? --      Excl. in GC? --    Updated Vital Signs BP (!) 186/79 (BP Location: Right Arm)   Pulse 76   Temp 98 F (36.7 C) (Oral)   Resp 14   Ht 5\' 4"  (1.626 m)   Wt 59 kg   SpO2 100%   BMI 22.31 kg/m   Visual Acuity Right Eye Distance:   Left Eye Distance:   Bilateral Distance:    Right Eye Near:   Left Eye Near:    Bilateral Near:      Physical Exam Vitals and nursing note reviewed.  Constitutional:      General: She is not in acute distress.    Appearance: Normal appearance. She is not ill-appearing.  HENT:     Head: Normocephalic and atraumatic.  Eyes:     General:        Right eye: No discharge.        Left eye: No discharge.     Conjunctiva/sclera: Conjunctivae normal.  Cardiovascular:     Rate and Rhythm: Normal rate and regular rhythm.  Pulmonary:     Effort: Pulmonary effort is normal.     Breath sounds: Normal breath sounds. No wheezing, rhonchi or rales.  Abdominal:     General: There is no distension.     Palpations: Abdomen is soft.     Tenderness: There is no abdominal tenderness. There is no right CVA tenderness or left CVA tenderness.  Neurological:     Mental Status: She is alert.     UC Treatments / Results  Labs (all labs ordered are listed, but only abnormal results are displayed) Labs Reviewed  POCT URINALYSIS DIP (DEVICE) - Abnormal; Notable for the following components:      Result Value   Hgb urine dipstick LARGE (*)    Nitrite POSITIVE (*)    Leukocytes,Ua SMALL (*)    All other components within normal limits  URINE CULTURE  POCT URINALYSIS DIPSTICK, ED / UC    EKG   Radiology No results found.  Procedures Procedures (including critical care time)  Medications Ordered in UC Medications - No data to display  Initial Impression / Assessment and Plan / UC Course  I have reviewed the triage vital signs and the nursing notes.  Pertinent labs & imaging results that were available during my care of the patient were reviewed by me and considered in my medical decision making (see chart for details).    80 year old female presents with UTI.  Sending culture.  Placing on Macrobid.  Final Clinical Impressions(s) / UC Diagnoses   Final diagnoses:  Acute cystitis with hematuria   Discharge Instructions   None    ED Prescriptions     Medication Sig Dispense Auth.  Provider  nitrofurantoin, macrocrystal-monohydrate, (MACROBID) 100 MG capsule Take 1 capsule (100 mg total) by mouth every 12 (twelve) hours. 14 capsule Everlene Other G, DO      PDMP not reviewed this encounter.   Everlene Other [place], [place] 12/14/20 [phone]

## 2020-12-14 NOTE — ED Triage Notes (Signed)
Patient c/o burning when urinating, urinary frequency, and lower back pain that started last night.

## 2020-12-16 ENCOUNTER — Ambulatory Visit: Payer: Medicare Other | Admitting: Urology

## 2020-12-17 LAB — URINE CULTURE: Culture: 80000 — AB

## 2021-01-15 ENCOUNTER — Other Ambulatory Visit: Payer: Self-pay | Admitting: *Deleted

## 2021-01-15 DIAGNOSIS — N390 Urinary tract infection, site not specified: Secondary | ICD-10-CM

## 2021-01-17 NOTE — Progress Notes (Signed)
01/20/2021 11:40 AM   Vanessa Cameron 06-Apr-1941 [id number]  Referring provider: Sofie Hartigan, MD [place],  [place] [address]  Urological history: 1. rUTI's -contributing factors of age, vaginal atrophy,  -RUS 2019 NED -documented positive urine cultures over the last year  Pan-sensitive E.coli 12/14/2020  Pan-sensitive E.coli 05/28/2020  2. Vaginal atrophy -manage with vaginal estrogen cream three nights weekly   Chief Complaint  Patient presents with   Follow-up    1 year with UA    HPI: Vanessa Cameron is a 80 y.o. female who presents today for her yearly visit.  UA benign.    PMH: Past Medical History:  Diagnosis Date   Anemia    iron deficiency   Arthritis    Arthritis    Heart murmur    nothing done for this   History of kidney stones    had once when parathyroid gland was malfunctioning   History of stomach ulcers    Hypertension    Thyroid disease     Surgical History: Past Surgical History:  Procedure Laterality Date   ABDOMINAL HYSTERECTOMY     ABDOMINAL HYSTERECTOMY     PARATHYROIDECTOMY  2011   THYROIDECTOMY     denies   TOTAL HIP ARTHROPLASTY Right 06/30/2018   Procedure: TOTAL HIP ARTHROPLASTY;  Surgeon: Corky Mull, MD;  Location: [facility] ORS;  Service: Orthopedics;  Laterality: Right;    Home Medications:  Allergies as of 01/20/2021       Reactions   Penicillins Shortness Of Breath, Itching, Swelling, Other (See Comments)   TONGUE SWELLING DID THE REACTION INVOLVE: Swelling of the face/tongue/throat, SOB, or low BP? Yes Sudden or severe rash/hives, skin peeling, or the inside of the mouth or nose? Yes Did it require medical treatment? Yes When did it last happen? 80 years old If all above answers are "NO", may proceed with cephalosporin use.   Nsaids Other (See Comments)   Had a bleeding ulcer . Corroded stomach lining at site of artery        Medication List        Accurate as of January 20, 2021 11:40  AM. If you have any questions, ask your nurse or doctor.          estradiol 0.1 MG/GM vaginal cream Commonly known as: ESTRACE Compounded cream  No applicator: use pea sized amount on the tip of finger before bed Monday, Wednesday, and Fridays   ferrous sulfate 325 (65 FE) MG tablet Take 325 mg by mouth daily.   lisinopril 10 MG tablet Commonly known as: ZESTRIL Take 10 mg by mouth daily.   nitrofurantoin (macrocrystal-monohydrate) 100 MG capsule Commonly known as: MACROBID Take 1 capsule (100 mg total) by mouth every 12 (twelve) hours.   traMADol 50 MG tablet Commonly known as: ULTRAM Take 0.5-1 tablets by mouth 2 (two) times daily as needed. 1/2 - 1 tablet as needed        Allergies:  Allergies  Allergen Reactions   Penicillins Shortness Of Breath, Itching, Swelling and Other (See Comments)    TONGUE SWELLING DID THE REACTION INVOLVE: Swelling of the face/tongue/throat, SOB, or low BP? Yes Sudden or severe rash/hives, skin peeling, or the inside of the mouth or nose? Yes Did it require medical treatment? Yes When did it last happen? 80 years old If all above answers are "NO", may proceed with cephalosporin use.    Nsaids Other (See Comments)    Had a bleeding  ulcer . Corroded stomach lining at site of artery    Family History: No family history on file.  Social History:  reports that she quit smoking about 22 years ago. Her smoking use included cigarettes. She smoked an average of .5 packs per day. She has never used smokeless tobacco. She reports current alcohol use of about 1.0 standard drink per week. She reports that she does not use drugs.  ROS: Pertinent ROS in HPI  Physical Exam: BP (!) 177/92   Pulse 72   Ht _0  (1.6 m)   Wt 130 lb (59 kg)   BMI 23.03 kg/m   Constitutional:  Well nourished. Alert and oriented, No acute distress. HEENT: [place] AT, mask in place.  Trachea midline Cardiovascular: No clubbing, cyanosis, or edema. Respiratory: Normal  respiratory effort, no increased work of breathing. GU: No CVA tenderness.  No bladder fullness or masses.  Atrophic external genitalia, normal pubic hair distribution, no lesions.  Normal urethral meatus, no lesions, no prolapse, no discharge.   No urethral masses, tenderness and/or tenderness. No bladder fullness, tenderness or masses. Pale vagina mucosa, fair estrogen effect, no discharge, no lesions, fair pelvic support, grade II cystocele and grade I rectocele noted.  Anus and perineum are without rashes or lesions.     Neurologic: Grossly intact, no focal deficits, moving all 4 extremities. Psychiatric: Normal mood and affect.    Laboratory Data: Urinalysis Component     Latest Ref Rng & Units 01/20/2021  Color, Urine     YELLOW YELLOW  Appearance     CLEAR CLEAR  Specific Gravity, Urine     1.005 - 1.030 1.010  pH     5.0 - 8.0 7.0  Glucose, UA     NEGATIVE mg/dL NEGATIVE  Hgb urine dipstick     NEGATIVE TRACE (A)  Bilirubin Urine     NEGATIVE NEGATIVE  Ketones, ur     NEGATIVE mg/dL NEGATIVE  Protein     NEGATIVE mg/dL NEGATIVE  Nitrite     NEGATIVE NEGATIVE  Leukocytes,Ua     NEGATIVE NEGATIVE  Squamous Epithelial / LPF     0 - 5 0-5  WBC, UA     0 - 5 WBC/hpf 0-5  RBC / HPF     0 - 5 RBC/hpf 0-5  Bacteria, UA     NONE SEEN FEW (A)  I have reviewed the labs.   Pertinent Imaging: N/A   Assessment & Plan:    1. rUTI's - criteria for recurrent UTI has almost been met with 2 or more infections in 6 months or 3 or greater infections in one year  -patient will good water intake - patient is instructed to take probiotics (yogurt, oral pills or vaginal suppositories), take cranberry pills or drink the juice and Vitamin C 1,000 mg daily to acidify the urine  -she only takes showers  2. Vaginal atrophy -managed with vaginal estrogen cream three nights weekly       -refill sent to pharmacy                                         Return in about 1 year (around  01/20/2022) for UA and exam .  These notes generated with voice recognition software. I apologize for typographical errors.  Zara Council, PA-C  [facility] [address]  [place], [place] [address] [phone]

## 2021-01-20 ENCOUNTER — Other Ambulatory Visit
Admission: RE | Admit: 2021-01-20 | Discharge: 2021-01-20 | Disposition: A | Discharge status: Home / Self Care | Payer: Medicare Other | Attending: Urology | Admitting: Urology

## 2021-01-20 ENCOUNTER — Other Ambulatory Visit: Payer: Self-pay

## 2021-01-20 ENCOUNTER — Encounter: Payer: Self-pay | Admitting: Urology

## 2021-01-20 ENCOUNTER — Ambulatory Visit (INDEPENDENT_AMBULATORY_CARE_PROVIDER_SITE_OTHER): Payer: Medicare Other | Admitting: Urology

## 2021-01-20 VITALS — BP 177/92 | HR 72 | Ht 63.0 in | Wt 130.0 lb

## 2021-01-20 DIAGNOSIS — N390 Urinary tract infection, site not specified: Secondary | ICD-10-CM

## 2021-01-20 DIAGNOSIS — N952 Postmenopausal atrophic vaginitis: Secondary | ICD-10-CM

## 2021-01-20 LAB — URINALYSIS, COMPLETE (UACMP) WITH MICROSCOPIC
Bilirubin Urine: NEGATIVE
Glucose, UA: NEGATIVE mg/dL
Ketones, ur: NEGATIVE mg/dL
Leukocytes,Ua: NEGATIVE
Nitrite: NEGATIVE
Protein, ur: NEGATIVE mg/dL
Specific Gravity, Urine: 1.01 (ref 1.005–1.030)
pH: 7 (ref 5.0–8.0)

## 2021-01-20 MED ORDER — ESTRADIOL 0.1 MG/GM VA CREA: TOPICAL_CREAM | VAGINAL | 12 refills | Status: DC

## 2021-02-20 ENCOUNTER — Other Ambulatory Visit: Payer: Self-pay | Admitting: Family Medicine

## 2021-02-20 ENCOUNTER — Other Ambulatory Visit: Payer: Self-pay | Admitting: *Deleted

## 2021-02-20 MED ORDER — ESTRADIOL 0.1 MG/GM VA CREA: TOPICAL_CREAM | VAGINAL | 11 refills | Status: DC

## 2021-02-24 ENCOUNTER — Ambulatory Visit
Admission: EM | Admit: 2021-02-24 | Discharge: 2021-02-24 | Disposition: A | Discharge status: Home / Self Care | Payer: Medicare Other | Attending: Internal Medicine | Admitting: Internal Medicine

## 2021-02-24 ENCOUNTER — Other Ambulatory Visit: Payer: Self-pay

## 2021-02-24 DIAGNOSIS — N3000 Acute cystitis without hematuria: Secondary | ICD-10-CM | POA: Diagnosis present

## 2021-02-24 LAB — URINALYSIS, COMPLETE (UACMP) WITH MICROSCOPIC
Bilirubin Urine: NEGATIVE
Glucose, UA: NEGATIVE mg/dL
Ketones, ur: NEGATIVE mg/dL
Nitrite: NEGATIVE
Protein, ur: NEGATIVE mg/dL
Specific Gravity, Urine: 1.01 (ref 1.005–1.030)
pH: 6.5 (ref 5.0–8.0)

## 2021-02-24 MED ORDER — SULFAMETHOXAZOLE-TRIMETHOPRIM 800-160 MG PO TABS: 1.0000 | ORAL_TABLET | Freq: Two times a day (BID) | ORAL | 0 refills | Status: AC

## 2021-02-24 NOTE — ED Triage Notes (Signed)
Pt here with C/O burning and frequency with urination since this AM, does have these often, tried to get appointment with her Urologist but was unable to get them to call her back today.

## 2021-02-24 NOTE — ED Provider Notes (Signed)
[facility]    CSN: [id number] Arrival date & time: 02/24/21  1616      History   Chief Complaint Chief Complaint  Patient presents with   Dysuria    HPI Vanessa Cameron is a 80 y.o. female with a history of recurrent UTIs comes to the urgent care with a 1 day history of dysuria, urgency and frequency.  Patient's symptoms started this morning and has been persistent.  No nausea or vomiting.  No fever or chills.  No flank pain.  No blood in urine.  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   Primary osteoarthritis of right hip 08/19/2017   Acute right-sided low back pain with sciatica 06/08/2017   Right hip pain 06/08/2017   Anemia 01/09/2014   Hypertension 01/09/2014   Primary hyperparathyroidism (HCC) 01/09/2014    Past Surgical History:  Procedure Laterality Date   ABDOMINAL HYSTERECTOMY     ABDOMINAL HYSTERECTOMY     PARATHYROIDECTOMY  2011   THYROIDECTOMY     denies   TOTAL HIP ARTHROPLASTY Right 06/30/2018   Procedure: TOTAL HIP ARTHROPLASTY;  Surgeon: Christena Flake, MD;  Location: [facility];  Service: Orthopedics;  Laterality: Right;    OB History   No obstetric history on file.      Home Medications    Prior to Admission medications   Medication Sig Start Date End Date Taking? Authorizing Provider  sulfamethoxazole-trimethoprim (BACTRIM DS) 800-160 MG tablet Take 1 tablet by mouth 2 (two) times daily for 5 days. 02/24/21 03/01/21 Yes Palin Tristan, Britta Mccreedy, MD  estradiol (ESTRACE) 0.1 MG/GM vaginal cream Estrogen Cream Instruction Discard applicator Apply pea sized amount to  tip of finger to urethra before bed. Wash hands well after application. Use Monday, Wednesday and Friday 02/20/21   Michiel Cowboy A, PA-C  ferrous sulfate 325 (65 FE) MG tablet Take 325 mg by mouth daily.     [provider]  lisinopril (PRINIVIL,ZESTRIL) 10 MG tablet Take 10 mg by mouth daily.  11/12/17   [provider]  nitrofurantoin, macrocrystal-monohydrate, (MACROBID) 100 MG capsule Take 1 capsule (100 mg total) by mouth every 12 (twelve) hours. 12/14/20   Tommie Sams, DO  traMADol (ULTRAM) 50 MG tablet Take 0.5-1 tablets by mouth 2 (two) times daily as needed. 1/2 - 1 tablet as needed 10/31/19   [provider]    Family History History reviewed. No pertinent family history.  Social History Social History   Tobacco Use   Smoking status: Former    Packs/day: 0.50    Types: Cigarettes    Quit date: 06/01/1998    Years since quitting: 22.7   Smokeless tobacco: Never   Tobacco comments:    quit 30 yrs ago  Vaping Use   Vaping Use: Never used  Substance Use Topics   Alcohol use: Yes    Alcohol/week: 1.0 standard drink    Types: 1 Glasses of wine per week   Drug use: Never  Allergies   Penicillins and Nsaids   Review of Systems Review of Systems  Gastrointestinal:  Negative for abdominal pain, nausea and vomiting.  Genitourinary:  Positive for dysuria, frequency and urgency. Negative for vaginal bleeding and vaginal discharge.    Physical Exam Triage Vital Signs ED Triage Vitals  Enc Vitals Group     BP 02/24/21 1658 (!) 186/88     Pulse Rate 02/24/21 1658 83     Resp 02/24/21 1658 18     Temp 02/24/21 1658 98.6 F (37 C)     Temp Source 02/24/21 1658 Oral     SpO2 02/24/21 1658 100 %     Weight 02/24/21 1656 130 lb (59 kg)     Height 02/24/21 1656 5\' 4"  (1.626 m)     Head Circumference --      Peak Flow --      Pain Score 02/24/21 1656 0     Pain Loc --      Pain Edu? --      Excl. in GC? --    No data found.  Updated  Vital Signs BP (!) 186/88 (BP Location: Right Arm) Comment: Pt states she always has high BP at dr.  Pulse 83   Temp 98.6 F (37 C) (Oral)   Resp 18   Ht 5\' 4"  (1.626 m)   Wt 59 kg   SpO2 100%   BMI 22.31 kg/m   Visual Acuity Right Eye Distance:   Left Eye Distance:   Bilateral Distance:    Right Eye Near:   Left Eye Near:    Bilateral Near:     Physical Exam Vitals and nursing note reviewed.  Constitutional:      General: She is not in acute distress.    Appearance: She is not ill-appearing.  Cardiovascular:     Rate and Rhythm: Normal rate and regular rhythm.     Pulses: Normal pulses.     Heart sounds: Normal heart sounds.  Pulmonary:     Effort: Pulmonary effort is normal.     Breath sounds: Normal breath sounds.  Abdominal:     General: Bowel sounds are normal.     Palpations: Abdomen is soft.  Musculoskeletal:        General: Normal range of motion.  Neurological:     Mental Status: She is alert.     UC Treatments / Results  Labs (all labs ordered are listed, but only abnormal results are displayed) Labs Reviewed  URINALYSIS, COMPLETE (UACMP) WITH MICROSCOPIC - Abnormal; Notable for the following components:      Result Value   Hgb urine dipstick SMALL (*)    Leukocytes,Ua SMALL (*)    Bacteria, UA FEW (*)    All other components within normal limits  URINE CULTURE    EKG   Radiology No results found.  Procedures Procedures (including critical care time)  Medications Ordered in UC Medications - No data to display  Initial Impression / Assessment and Plan / UC Course  I have reviewed the triage vital signs and the nursing notes.  Pertinent labs & imaging results that were available during my care of the patient were reviewed by me and considered in my medical decision making (see chart for details).     1.  Acute cystitis without hematuria: Point-of-care urinalysis is positive for leukocyte Estrace, bacteria and WBC of 21-50 per  high-power field Urine cultures have been sent Bactrim double strength 1 tablet twice daily for 5 days  We will call patient with recommendations if labs are abnormal Patient is advised to increase oral fluid intake. Return precautions given Final Clinical Impressions(s) / UC Diagnoses   Final diagnoses:  Acute cystitis without hematuria     Discharge Instructions      Please take medications as prescribed Urine cultures will be sent and we will call you with recommendations if labs are abnormal If you experience flank pain, fever, persistent nausea or vomiting or confusion please return to urgent care to be reevaluated.   ED Prescriptions     Medication Sig Dispense Auth. Provider   sulfamethoxazole-trimethoprim (BACTRIM DS) 800-160 MG tablet Take 1 tablet by mouth 2 (two) times daily for 5 days. 10 tablet Sana Tessmer, Britta Mccreedy, MD      PDMP not reviewed this encounter.   Merrilee Jansky, MD 02/24/21 1723

## 2021-02-24 NOTE — Discharge Instructions (Addendum)
Please take medications as prescribed Urine cultures will be sent and we will call you with recommendations if labs are abnormal If you experience flank pain, fever, persistent nausea or vomiting or confusion please return to urgent care to be reevaluated.

## 2021-05-18 ENCOUNTER — Ambulatory Visit
Admission: EM | Admit: 2021-05-18 | Discharge: 2021-05-18 | Disposition: A | Discharge status: Home / Self Care | Payer: Medicare Other | Attending: Physician Assistant | Admitting: Physician Assistant

## 2021-05-18 ENCOUNTER — Encounter: Payer: Self-pay | Admitting: Emergency Medicine

## 2021-05-18 ENCOUNTER — Other Ambulatory Visit: Payer: Self-pay

## 2021-05-18 DIAGNOSIS — J329 Chronic sinusitis, unspecified: Secondary | ICD-10-CM | POA: Diagnosis not present

## 2021-05-18 DIAGNOSIS — J40 Bronchitis, not specified as acute or chronic: Secondary | ICD-10-CM

## 2021-05-18 MED ORDER — BENZONATATE 100 MG PO CAPS: 100.0000 mg | ORAL_CAPSULE | Freq: Three times a day (TID) | ORAL | 0 refills | Status: DC

## 2021-05-18 MED ORDER — DOXYCYCLINE HYCLATE 100 MG PO CAPS: 100.0000 mg | ORAL_CAPSULE | Freq: Two times a day (BID) | ORAL | 0 refills | Status: DC

## 2021-05-18 NOTE — ED Triage Notes (Signed)
Patient c/o cough, chest congestion, headache, and fever that started 6 days ago.  Patient states that she has taken 2 home covid test and both negative.

## 2021-05-18 NOTE — ED Provider Notes (Addendum)
[facility]    CSN: [id number] Arrival date & time: 05/18/21  7062      History   Chief Complaint Chief Complaint  Patient presents with   Cough   Headache    HPI Vanessa Cameron is a 80 y.o. female.   Patient presents today with a 6-day history of URI symptoms including nasal congestion, sinus pressure, cough.  Reports that she has developed a fever as well as a headache prompting evaluation today.  Denies any chest pain, shortness of breath, nausea, vomiting.  Denies any known sick contacts.  She is up-to-date on influenza and COVID-19 vaccinations.  Has not had COVID in the past.  She denies any history of asthma, allergies, smoking, COPD.  She has had pneumonia twice and reports current symptoms are less severe than previous episodes of this condition.  She is up-to-date on pneumonia vaccine.  Denies history of diabetes or immunosuppression.  Denies any recent antibiotic use.   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   Primary osteoarthritis of right hip 08/19/2017   Acute right-sided low back pain with sciatica 06/08/2017   Right hip pain 06/08/2017   Anemia 01/09/2014   Hypertension 01/09/2014   Primary hyperparathyroidism (HCC) 01/09/2014    Past Surgical History:  Procedure Laterality Date   ABDOMINAL HYSTERECTOMY     ABDOMINAL HYSTERECTOMY     PARATHYROIDECTOMY  2011   THYROIDECTOMY     denies   TOTAL HIP ARTHROPLASTY Right 06/30/2018   Procedure: TOTAL HIP ARTHROPLASTY;  Surgeon: Christena Flake, MD;  Location: [facility];  Service: Orthopedics;  Laterality: Right;    OB History   No obstetric history  on file.      Home Medications    Prior to Admission medications   Medication Sig Start Date End Date Taking? Authorizing Provider  benzonatate (TESSALON) 100 MG capsule Take 1 capsule (100 mg total) by mouth every 8 (eight) hours. 05/18/21  Yes Chibueze Beasley K, PA-C  doxycycline (VIBRAMYCIN) 100 MG capsule Take 1 capsule (100 mg total) by mouth 2 (two) times daily. 05/18/21  Yes Herberto Ledwell, Denny Peon K, PA-C  estradiol (ESTRACE) 0.1 MG/GM vaginal cream Estrogen Cream Instruction Discard applicator Apply pea sized amount to tip of finger to urethra before bed. Wash hands well after application. Use Monday, Wednesday and Friday 02/20/21  Yes McGowan, Carollee Herter A, PA-C  ferrous sulfate 325 (65 FE) MG tablet Take 325 mg by mouth daily.    Yes [provider]  lisinopril (PRINIVIL,ZESTRIL) 10 MG tablet Take 10 mg by mouth daily.  11/12/17  Yes [provider]  traMADol (ULTRAM) 50 MG tablet Take 0.5-1 tablets by mouth 2 (two) times daily as needed. 1/2 - 1 tablet as needed 10/31/19  Yes [provider]    Family History History reviewed. No pertinent family history.  Social History Social History   Tobacco Use   Smoking status: Former    Packs/day: 0.50    Types: Cigarettes    Quit date: 06/01/1998    Years since quitting:  22.9   Smokeless tobacco: Never   Tobacco comments:    quit 30 yrs ago  Vaping Use   Vaping Use: Never used  Substance Use Topics   Alcohol use: Yes    Alcohol/week: 1.0 standard drink    Types: 1 Glasses of wine per week   Drug use: Never     Allergies   Penicillins and Nsaids   Review of Systems Review of Systems  Constitutional:  Positive for activity change, fatigue and fever. Negative for appetite change.  HENT:  Positive for congestion and sinus pressure. Negative for sneezing and sore throat.   Respiratory:  Positive for cough. Negative for shortness of breath.   Cardiovascular:  Negative for chest pain.  Gastrointestinal:  Negative  for abdominal pain, diarrhea, nausea and vomiting.  Musculoskeletal:  Negative for arthralgias and myalgias.  Neurological:  Positive for headaches. Negative for dizziness and light-headedness.    Physical Exam Triage Vital Signs ED Triage Vitals  Enc Vitals Group     BP 05/18/21 0825 (!) 161/74     Pulse Rate 05/18/21 0825 91     Resp 05/18/21 0825 14     Temp 05/18/21 0825 98.4 F (36.9 C)     Temp Source 05/18/21 0825 Oral     SpO2 05/18/21 0825 100 %     Weight 05/18/21 0823 130 lb 1.1 oz (59 kg)     Height 05/18/21 0823 5\' 4"  (1.626 m)     Head Circumference --      Peak Flow --      Pain Score 05/18/21 0823 7     Pain Loc --      Pain Edu? --      Excl. in GC? --    No data found.  Updated Vital Signs BP (!) 161/74 (BP Location: Left Arm)    Pulse 91    Temp 98.4 F (36.9 C) (Oral)    Resp 14    Ht 5\' 4"  (1.626 m)    Wt 130 lb 1.1 oz (59 kg)    SpO2 100%    BMI 22.33 kg/m   Visual Acuity Right Eye Distance:   Left Eye Distance:   Bilateral Distance:    Right Eye Near:   Left Eye Near:    Bilateral Near:     Physical Exam Vitals reviewed.  Constitutional:      General: She is awake. She is not in acute distress.    Appearance: Normal appearance. She is well-developed. She is not ill-appearing.     Comments: Very pleasant female appears stated age in no acute distress sitting comfortably in exam room  HENT:     Head: Normocephalic and atraumatic.     Right Ear: Tympanic membrane, ear canal and external ear normal. Tympanic membrane is not erythematous or bulging.     Left Ear: Tympanic membrane, ear canal and external ear normal. Tympanic membrane is not erythematous or bulging.     Nose:     Right Sinus: Maxillary sinus tenderness present. No frontal sinus tenderness.     Left Sinus: Maxillary sinus tenderness present. No frontal sinus tenderness.     Mouth/Throat:     Pharynx: Uvula midline. No oropharyngeal exudate or posterior oropharyngeal erythema.   Cardiovascular:     Rate and Rhythm: Normal rate and regular rhythm.     Heart sounds: S1 normal and S2 normal. Murmur heard.  Pulmonary:     Effort: Pulmonary effort is normal.  Breath sounds: Normal breath sounds. No wheezing, rhonchi or rales.     Comments: Clear to auscultation bilaterally Psychiatric:        Behavior: Behavior is cooperative.     UC Treatments / Results  Labs (all labs ordered are listed, but only abnormal results are displayed) Labs Reviewed - No data to display  EKG   Radiology No results found.  Procedures Procedures (including critical care time)  Medications Ordered in UC Medications - No data to display  Initial Impression / Assessment and Plan / UC Course  I have reviewed the triage vital signs and the nursing notes.  Pertinent labs & imaging results that were available during my care of the patient were reviewed by me and considered in my medical decision making (see chart for details).     No indication for viral testing given patient has been symptomatic for over 5 days and this would not change management.  Given worsening symptoms we are going to start antibiotic.  We discussed potential utility of chest x-ray but given oxygen saturation of 100% and normal lungs sound this was deferred.  Discussed that if symptoms not improving quickly with antibiotic she would need to be reevaluated to consider chest x-ray.  She was prescribed doxycycline given penicillin allergy.  Recommend she use over-the-counter medications including Mucinex and Flonase for additional symptom relief.  She was given Tessalon for cough.  She is to rest and drink plenty of fluid.  Recommended close follow-up with her PCP within the next 48 to 72 hours.  Discussed at length alarm symptoms that warrant emergent evaluation including chest pain, shortness of breath, high fever not responding to medication, nausea, vomiting.  She return precautions given to which she expressed  understanding.  Final Clinical Impressions(s) / UC Diagnoses   Final diagnoses:  Sinobronchitis     Discharge Instructions      Start doxycycline 100 mg twice daily.  Use Tessalon up to 3 times a day as needed for cough.  Use Mucinex and Flonase for additional symptom relief.  Follow-up with your primary care provider within a few days to ensure symptoms are improving.  As we discussed, if anything worsens and you develop any chest pain, shortness of breath, high fever, nausea/vomiting interfering with oral intake, weakness you need to go to the emergency room.     ED Prescriptions     Medication Sig Dispense Auth. Provider   doxycycline (VIBRAMYCIN) 100 MG capsule Take 1 capsule (100 mg total) by mouth 2 (two) times daily. 20 capsule Britain Anagnos K, PA-C   benzonatate (TESSALON) 100 MG capsule Take 1 capsule (100 mg total) by mouth every 8 (eight) hours. 21 capsule Gianny Killman K, PA-C      PDMP not reviewed this encounter.   Jeani Hawking, PA-C 05/18/21 0851    Jeani Hawking, PA-C 05/18/21 1018

## 2021-05-18 NOTE — Discharge Instructions (Signed)
Start doxycycline 100 mg twice daily.  Use Tessalon up to 3 times a day as needed for cough.  Use Mucinex and Flonase for additional symptom relief.  Follow-up with your primary care provider within a few days to ensure symptoms are improving.  As we discussed, if anything worsens and you develop any chest pain, shortness of breath, high fever, nausea/vomiting interfering with oral intake, weakness you need to go to the emergency room.

## 2021-06-05 ENCOUNTER — Other Ambulatory Visit: Payer: Self-pay

## 2021-06-05 ENCOUNTER — Encounter: Payer: Self-pay | Admitting: Ophthalmology

## 2021-06-19 NOTE — Discharge Instructions (Signed)

## 2021-06-23 ENCOUNTER — Ambulatory Visit
Admission: RE | Admit: 2021-06-23 | Discharge: 2021-06-23 | Disposition: A | Discharge status: Home / Self Care | Payer: Medicare Other | Attending: Ophthalmology | Admitting: Ophthalmology

## 2021-06-23 ENCOUNTER — Other Ambulatory Visit: Payer: Self-pay

## 2021-06-23 ENCOUNTER — Ambulatory Visit: Payer: Medicare Other | Admitting: Anesthesiology

## 2021-06-23 ENCOUNTER — Encounter
Admission: RE | Disposition: A | Discharge status: Home / Self Care | Payer: Self-pay | Source: Home / Self Care | Attending: Ophthalmology

## 2021-06-23 ENCOUNTER — Encounter: Payer: Self-pay | Admitting: Ophthalmology

## 2021-06-23 DIAGNOSIS — H2511 Age-related nuclear cataract, right eye: Secondary | ICD-10-CM | POA: Insufficient documentation

## 2021-06-23 DIAGNOSIS — I10 Essential (primary) hypertension: Secondary | ICD-10-CM | POA: Insufficient documentation

## 2021-06-23 HISTORY — PX: CATARACT EXTRACTION W/PHACO: SHX586

## 2021-06-23 SURGERY — PHACOEMULSIFICATION, CATARACT, WITH IOL INSERTION
Anesthesia: Monitor Anesthesia Care | Site: Eye | Laterality: Right

## 2021-06-23 MED ORDER — TETRACAINE HCL 0.5 % OP SOLN
1.0000 [drp] | OPHTHALMIC | Status: DC | PRN
  Administered 2021-06-23 (×3): 1 [drp] via OPHTHALMIC

## 2021-06-23 MED ORDER — LIDOCAINE HCL (PF) 2 % IJ SOLN
INTRAOCULAR | Status: DC | PRN
  Administered 2021-06-23: 1 mL via INTRAOCULAR

## 2021-06-23 MED ORDER — SIGHTPATH DOSE#1 SODIUM HYALURONATE 23 MG/ML IO SOLUTION
PREFILLED_SYRINGE | INTRAOCULAR | Status: DC | PRN
  Administered 2021-06-23: 0.6 mL via INTRAOCULAR

## 2021-06-23 MED ORDER — MIDAZOLAM HCL 2 MG/2ML IJ SOLN
INTRAMUSCULAR | Status: DC | PRN
  Administered 2021-06-23: 1 mg via INTRAVENOUS

## 2021-06-23 MED ORDER — SIGHTPATH DOSE#1 BSS IO SOLN
INTRAOCULAR | Status: DC | PRN
  Administered 2021-06-23: 76 mL via OPHTHALMIC

## 2021-06-23 MED ORDER — SIGHTPATH DOSE#1 BSS IO SOLN
INTRAOCULAR | Status: DC | PRN
  Administered 2021-06-23: 15 mL

## 2021-06-23 MED ORDER — FENTANYL CITRATE (PF) 100 MCG/2ML IJ SOLN
INTRAMUSCULAR | Status: DC | PRN
  Administered 2021-06-23: 50 ug via INTRAVENOUS

## 2021-06-23 MED ORDER — ARMC OPHTHALMIC DILATING DROPS
1.0000 "application " | OPHTHALMIC | Status: DC | PRN
  Administered 2021-06-23 (×3): 1 via OPHTHALMIC

## 2021-06-23 MED ORDER — LACTATED RINGERS IV SOLN: INTRAVENOUS | Status: DC

## 2021-06-23 MED ORDER — ACETAMINOPHEN 160 MG/5ML PO SOLN: 325.0000 mg | ORAL | Status: DC | PRN

## 2021-06-23 MED ORDER — ACETAMINOPHEN 325 MG PO TABS: 325.0000 mg | ORAL_TABLET | ORAL | Status: DC | PRN

## 2021-06-23 MED ORDER — MOXIFLOXACIN HCL 0.5 % OP SOLN
OPHTHALMIC | Status: DC | PRN
  Administered 2021-06-23: 0.2 mL via OPHTHALMIC

## 2021-06-23 MED ORDER — SIGHTPATH DOSE#1 SODIUM HYALURONATE 10 MG/ML IO SOLUTION
PREFILLED_SYRINGE | INTRAOCULAR | Status: DC | PRN
  Administered 2021-06-23: 0.85 mL via INTRAOCULAR

## 2021-06-23 SURGICAL SUPPLY — 16 items
CANNULA ANT/CHMB 27G (MISCELLANEOUS) IMPLANT
CANNULA ANT/CHMB 27GA (MISCELLANEOUS) ×3 IMPLANT
CATARACT SUITE SIGHTPATH (MISCELLANEOUS) ×3 IMPLANT
DISSECTOR HYDRO NUCLEUS 50X22 (MISCELLANEOUS) ×3 IMPLANT
FEE CATARACT SUITE SIGHTPATH (MISCELLANEOUS) ×1 IMPLANT
GLOVE SURG GAMMEX PI TX LF 7.5 (GLOVE) ×3 IMPLANT
GLOVE SURG SYN 8.5  E (GLOVE) ×2
GLOVE SURG SYN 8.5 E (GLOVE) ×1 IMPLANT
GLOVE SURG SYN 8.5 PF PI (GLOVE) ×1 IMPLANT
LENS IOL TECNIS EYHANCE 22.0 (Intraocular Lens) ×2 IMPLANT
NDL FILTER BLUNT 18X1 1/2 (NEEDLE) ×1 IMPLANT
NEEDLE FILTER BLUNT 18X 1/2SAF (NEEDLE) ×2
NEEDLE FILTER BLUNT 18X1 1/2 (NEEDLE) ×1 IMPLANT
SYR 3ML LL SCALE MARK (SYRINGE) ×3 IMPLANT
SYR 5ML LL (SYRINGE) ×3 IMPLANT
WATER STERILE IRR 250ML POUR (IV SOLUTION) ×3 IMPLANT

## 2021-06-23 NOTE — Anesthesia Postprocedure Evaluation (Signed)
Anesthesia Post Note  Patient: Vanessa Cameron  Procedure(s) Performed: CATARACT EXTRACTION PHACO AND INTRAOCULAR LENS PLACEMENT (IOC) RIGHT (Right: Eye)     Patient location during evaluation: PACU Anesthesia Type: MAC Level of consciousness: awake and alert Pain management: pain level controlled Vital Signs Assessment: post-procedure vital signs reviewed and stable Respiratory status: spontaneous breathing, nonlabored ventilation, respiratory function stable and patient connected to nasal cannula oxygen Cardiovascular status: stable and blood pressure returned to baseline Postop Assessment: no apparent nausea or vomiting Anesthetic complications: no   No notable events documented.  Trecia Rogers

## 2021-06-23 NOTE — H&P (Signed)
[facility]   Primary Care Physician:  Marina Goodell, MD Ophthalmologist: Dr. Willey Blade  Pre-Procedure History & Physical: HPI:  Vanessa Cameron is a 81 y.o. female here for cataract surgery.   Past Medical History:  Diagnosis Date   Anemia    iron deficiency   Arthritis    Arthritis    Heart murmur    nothing done for this   History of kidney stones    had once when parathyroid gland was malfunctioning   History of stomach ulcers    Hypertension    Thyroid disease     Past Surgical History:  Procedure Laterality Date   ABDOMINAL HYSTERECTOMY     ABDOMINAL HYSTERECTOMY     PARATHYROIDECTOMY  2011   THYROIDECTOMY     denies   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 Authorizing Provider  estradiol (ESTRACE) 0.1 MG/GM vaginal cream Estrogen Cream Instruction Discard applicator Apply pea sized amount to tip of finger to urethra before bed. Wash hands well after application. Use Monday, Wednesday and Friday 02/20/21  Yes McGowan, Carollee Herter A, PA-C  lisinopril (PRINIVIL,ZESTRIL) 10 MG tablet Take 10 mg by mouth daily.  11/12/17  Yes [provider]  traMADol (ULTRAM) 50 MG tablet Take 0.5-1 tablets by mouth 2 (two) times daily as needed. 1/2 - 1 tablet as needed 10/31/19  Yes [provider]  benzonatate (TESSALON) 100 MG capsule Take 1 capsule (100 mg total) by mouth every 8 (eight) hours. Patient not taking: Reported on 06/05/2021 05/18/21   Raspet, Noberto Retort, PA-C  doxycycline (VIBRAMYCIN) 100 MG capsule Take 1 capsule (100 mg total) by mouth 2 (two) times daily. Patient not taking: Reported on 06/05/2021 05/18/21   Raspet, Denny Peon K, PA-C  ferrous sulfate 325 (65 FE) MG tablet Take 325 mg by mouth daily.  Patient not taking: Reported on 06/05/2021     [provider]    Allergies as of 04/16/2021 - Review Complete 02/24/2021  Allergen Reaction Noted   Penicillins Shortness Of Breath, Itching, Swelling, and Other (See Comments) 01/05/2014   Nsaids Other (See Comments) 01/05/2014    History reviewed. No pertinent family history.  Social History   Socioeconomic History   Marital status: Widowed    Spouse name: Not on file   Number of children: Not on file   Years of education: Not on file   Highest education level: Not on file  Occupational History   Occupation: ct scan operator    Comment: retired  Tobacco Use   Smoking status: Former    Packs/day: 0.50    Types: Cigarettes    Quit date: 06/01/1998    Years since quitting: 23.0   Smokeless tobacco: Never   Tobacco comments:    quit 30 yrs ago  Vaping Use   Vaping Use: Never used  Substance and Sexual Activity   Alcohol use: Yes    Alcohol/week: 1.0 standard drink    Types: 1 Glasses of wine per week   Drug use: Never   Sexual activity: Not on file  Other Topics Concern   Not on file  Social History Narrative   Not on file   Social Determinants of Health   Financial Resource Strain: Not on file  Food Insecurity: Not on file  Transportation Needs: Not on file  Physical Activity: Not on file  Stress: Not on file  Social Connections: Not on file  Intimate Partner Violence: Not on file    Review of Systems: See HPI, otherwise negative ROS  Physical Exam: Ht 5\' 4"  (1.626 m)    Wt 59 kg    BMI 22.31 kg/m  General:   Alert, cooperative in NAD Head:  Normocephalic and atraumatic. Respiratory:  Normal work of breathing. Cardiovascular:  RRR  Impression/Plan: Vanessa Cameron is here for cataract surgery.  Risks, benefits, limitations, and alternatives regarding cataract surgery have been reviewed with the patient.  Questions have been answered.  All parties agreeable.   Cira Rue, MD  06/23/2021, 10:38 AM

## 2021-06-23 NOTE — Transfer of Care (Signed)
Immediate Anesthesia Transfer of Care Note  Patient: Vanessa Cameron  Procedure(s) Performed: CATARACT EXTRACTION PHACO AND INTRAOCULAR LENS PLACEMENT (IOC) RIGHT (Right: Eye)  Patient Location: PACU  Anesthesia Type: MAC  Level of Consciousness: awake, alert  and patient cooperative  Airway and Oxygen Therapy: Patient Spontanous Breathing and Patient connected to supplemental oxygen  Post-op Assessment: Post-op Vital signs reviewed, Patient's Cardiovascular Status Stable, Respiratory Function Stable, Patent Airway and No signs of Nausea or vomiting  Post-op Vital Signs: Reviewed and stable  Complications: No notable events documented.

## 2021-06-23 NOTE — Anesthesia Preprocedure Evaluation (Signed)
Anesthesia Evaluation  Patient identified by MRN, date of birth, ID band Patient awake    Reviewed: Allergy & Precautions, H&P , NPO status , Patient's Chart, lab work & pertinent test results, reviewed documented beta blocker date and time   Airway Mallampati: I  TM Distance: >3 FB Neck ROM: full    Dental no notable dental hx.    Pulmonary former smoker,    Pulmonary exam normal breath sounds clear to auscultation       Cardiovascular Exercise Tolerance: Good hypertension, Normal cardiovascular exam Rhythm:regular Rate:Normal     Neuro/Psych negative neurological ROS  negative psych ROS   GI/Hepatic negative GI ROS, Neg liver ROS,   Endo/Other  Primary hyperparathyroidism  Renal/GU negative Renal ROS  negative genitourinary   Musculoskeletal   Abdominal   Peds  Hematology negative hematology ROS (+)   Anesthesia Other Findings   Reproductive/Obstetrics negative OB ROS                             Anesthesia Physical Anesthesia Plan  ASA: 2  Anesthesia Plan: MAC   Post-op Pain Management:    Induction:   PONV Risk Score and Plan:   Airway Management Planned:   Additional Equipment:   Intra-op Plan:   Post-operative Plan:   Informed Consent: I have reviewed the patients History and Physical, chart, labs and discussed the procedure including the risks, benefits and alternatives for the proposed anesthesia with the patient or authorized representative who has indicated his/her understanding and acceptance.     Dental Advisory Given  Plan Discussed with: CRNA and Anesthesiologist  Anesthesia Plan Comments:         Anesthesia Quick Evaluation

## 2021-06-23 NOTE — Op Note (Signed)
OPERATIVE NOTE  Vanessa Cameron [id number] 06/23/2021   PREOPERATIVE DIAGNOSIS:  Nuclear sclerotic cataract right eye.  H25.11   POSTOPERATIVE DIAGNOSIS:    Nuclear sclerotic cataract right eye.     PROCEDURE:  Phacoemusification with posterior chamber intraocular lens placement of the right eye   LENS:   Implant Name Type Inv. Item Serial No. Manufacturer Lot No. LRB No. Used Action  LENS IOL TECNIS EYHANCE 22.0 - [id number] Intraocular Lens LENS IOL TECNIS EYHANCE 22.0 [id number] [employer]  Right 1 Implanted       Procedure(s) with comments: CATARACT EXTRACTION PHACO AND INTRAOCULAR LENS PLACEMENT (IOC) RIGHT (Right) - 10.26 00:52.3  DIB00 +22.0   ULTRASOUND TIME: 0 minutes 52 seconds.  CDE 10.26   SURGEON:  Willey Blade, MD, MPH  ANESTHESIOLOGIST: Anesthesiologist: Baxter Flattery, MD CRNA: Michaele Offer, CRNA   ANESTHESIA:  Topical with tetracaine drops augmented with 1% preservative-free intracameral lidocaine.  ESTIMATED BLOOD LOSS: less than 1 mL.   COMPLICATIONS:  None.   DESCRIPTION OF PROCEDURE:  The patient was identified in the holding room and transported to the operating room and placed in the supine position under the operating microscope.  The right eye was identified as the operative eye and it was prepped and draped in the usual sterile ophthalmic fashion.   A 1.0 millimeter clear-corneal paracentesis was made at the 10:30 position. 0.5 ml of preservative-free 1% lidocaine with epinephrine was injected into the anterior chamber.  The anterior chamber was filled with Healon 5 viscoelastic.  A 2.4 millimeter keratome was used to make a near-clear corneal incision at the 8:00 position.  A curvilinear capsulorrhexis was made with a cystotome and capsulorrhexis forceps.  Balanced salt solution was used to hydrodissect and hydrodelineate the nucleus.   Phacoemulsification was then used in stop and chop fashion to remove the lens nucleus and epinucleus.  The remaining  cortex was then removed using the irrigation and aspiration handpiece. Healon was then placed into the capsular bag to distend it for lens placement.  A lens was then injected into the capsular bag.  The remaining viscoelastic was aspirated.   Wounds were hydrated with balanced salt solution.  The anterior chamber was inflated to a physiologic pressure with balanced salt solution.   Intracameral vigamox 0.1 mL undiluted was injected into the eye and a drop placed onto the ocular surface.  No wound leaks were noted.  The patient was taken to the recovery room in stable condition without complications of anesthesia or surgery  Willey Blade 06/23/2021, 11:05 AM

## 2021-06-24 ENCOUNTER — Encounter: Payer: Self-pay | Admitting: Ophthalmology

## 2021-06-25 ENCOUNTER — Encounter: Payer: Self-pay | Admitting: Ophthalmology

## 2021-07-03 NOTE — Discharge Instructions (Signed)

## 2021-07-07 ENCOUNTER — Encounter: Payer: Self-pay | Admitting: Ophthalmology

## 2021-07-07 ENCOUNTER — Ambulatory Visit: Payer: Medicare Other | Admitting: Anesthesiology

## 2021-07-07 ENCOUNTER — Other Ambulatory Visit: Payer: Self-pay

## 2021-07-07 ENCOUNTER — Ambulatory Visit
Admission: RE | Admit: 2021-07-07 | Discharge: 2021-07-07 | Disposition: A | Discharge status: Home / Self Care | Payer: Medicare Other | Attending: Ophthalmology | Admitting: Ophthalmology

## 2021-07-07 ENCOUNTER — Encounter
Admission: RE | Disposition: A | Discharge status: Home / Self Care | Payer: Self-pay | Source: Home / Self Care | Attending: Ophthalmology

## 2021-07-07 DIAGNOSIS — H2512 Age-related nuclear cataract, left eye: Secondary | ICD-10-CM | POA: Insufficient documentation

## 2021-07-07 DIAGNOSIS — Z87891 Personal history of nicotine dependence: Secondary | ICD-10-CM | POA: Diagnosis not present

## 2021-07-07 DIAGNOSIS — I10 Essential (primary) hypertension: Secondary | ICD-10-CM | POA: Insufficient documentation

## 2021-07-07 DIAGNOSIS — E892 Postprocedural hypoparathyroidism: Secondary | ICD-10-CM | POA: Insufficient documentation

## 2021-07-07 HISTORY — PX: CATARACT EXTRACTION W/PHACO: SHX586

## 2021-07-07 SURGERY — PHACOEMULSIFICATION, CATARACT, WITH IOL INSERTION
Anesthesia: Monitor Anesthesia Care | Site: Eye | Laterality: Left

## 2021-07-07 MED ORDER — SIGHTPATH DOSE#1 BSS IO SOLN
INTRAOCULAR | Status: DC | PRN
  Administered 2021-07-07: 15 mL

## 2021-07-07 MED ORDER — MOXIFLOXACIN HCL 0.5 % OP SOLN
OPHTHALMIC | Status: DC | PRN
  Administered 2021-07-07: 0.2 mL via OPHTHALMIC

## 2021-07-07 MED ORDER — FENTANYL CITRATE (PF) 100 MCG/2ML IJ SOLN
INTRAMUSCULAR | Status: DC | PRN
Start: 2021-07-07 — End: 2021-07-07
  Administered 2021-07-07: 50 ug via INTRAVENOUS

## 2021-07-07 MED ORDER — SIGHTPATH DOSE#1 BSS IO SOLN
INTRAOCULAR | Status: DC | PRN
  Administered 2021-07-07: 88 mL via OPHTHALMIC

## 2021-07-07 MED ORDER — SIGHTPATH DOSE#1 SODIUM HYALURONATE 10 MG/ML IO SOLUTION
PREFILLED_SYRINGE | INTRAOCULAR | Status: DC | PRN
  Administered 2021-07-07: 0.85 mL via INTRAOCULAR

## 2021-07-07 MED ORDER — ACETAMINOPHEN 325 MG PO TABS: 325.0000 mg | ORAL_TABLET | Freq: Once | ORAL | Status: DC

## 2021-07-07 MED ORDER — SIGHTPATH DOSE#1 SODIUM HYALURONATE 23 MG/ML IO SOLUTION
PREFILLED_SYRINGE | INTRAOCULAR | Status: DC | PRN
  Administered 2021-07-07: 0.6 mL via INTRAOCULAR

## 2021-07-07 MED ORDER — ARMC OPHTHALMIC DILATING DROPS
1.0000 "application " | OPHTHALMIC | Status: DC | PRN
  Administered 2021-07-07 (×3): 1 via OPHTHALMIC

## 2021-07-07 MED ORDER — TETRACAINE HCL 0.5 % OP SOLN
1.0000 [drp] | OPHTHALMIC | Status: DC | PRN
  Administered 2021-07-07 (×3): 1 [drp] via OPHTHALMIC

## 2021-07-07 MED ORDER — LACTATED RINGERS IV SOLN: INTRAVENOUS | Status: DC

## 2021-07-07 MED ORDER — LIDOCAINE HCL (PF) 2 % IJ SOLN
INTRAOCULAR | Status: DC | PRN
  Administered 2021-07-07: 1 mL via INTRAOCULAR

## 2021-07-07 MED ORDER — ACETAMINOPHEN 160 MG/5ML PO SOLN: 325.0000 mg | Freq: Once | ORAL | Status: DC

## 2021-07-07 MED ORDER — MIDAZOLAM HCL 2 MG/2ML IJ SOLN
INTRAMUSCULAR | Status: DC | PRN
  Administered 2021-07-07: 2 mg via INTRAVENOUS

## 2021-07-07 SURGICAL SUPPLY — 14 items
CATARACT SUITE SIGHTPATH (MISCELLANEOUS) ×2 IMPLANT
DISSECTOR HYDRO NUCLEUS 50X22 (MISCELLANEOUS) ×2 IMPLANT
FEE CATARACT SUITE SIGHTPATH (MISCELLANEOUS) ×1 IMPLANT
GLOVE SURG GAMMEX PI TX LF 7.5 (GLOVE) ×2 IMPLANT
GLOVE SURG SYN 8.5  E (GLOVE) ×1
GLOVE SURG SYN 8.5 E (GLOVE) ×1 IMPLANT
GLOVE SURG SYN 8.5 PF PI (GLOVE) ×1 IMPLANT
LENS IOL TECNIS EYHANCE 22.0 (Intraocular Lens) ×1 IMPLANT
NDL FILTER BLUNT 18X1 1/2 (NEEDLE) ×1 IMPLANT
NEEDLE FILTER BLUNT 18X 1/2SAF (NEEDLE) ×1
NEEDLE FILTER BLUNT 18X1 1/2 (NEEDLE) ×1 IMPLANT
SYR 3ML LL SCALE MARK (SYRINGE) ×2 IMPLANT
SYR 5ML LL (SYRINGE) ×2 IMPLANT
WATER STERILE IRR 250ML POUR (IV SOLUTION) ×2 IMPLANT

## 2021-07-07 NOTE — Anesthesia Procedure Notes (Signed)
Procedure Name: MAC Date/Time: 07/07/2021 11:34 AM Performed by: Jeannene Patella, CRNA Pre-anesthesia Checklist: Patient identified, Emergency Drugs available, Suction available, Timeout performed and Patient being monitored Patient Re-evaluated:Patient Re-evaluated prior to induction Oxygen Delivery Method: Nasal cannula Placement Confirmation: positive ETCO2

## 2021-07-07 NOTE — Transfer of Care (Signed)
Immediate Anesthesia Transfer of Care Note  Patient: Vanessa Cameron  Procedure(s) Performed: CATARACT EXTRACTION PHACO AND INTRAOCULAR LENS PLACEMENT (IOC) LEFT (Left: Eye)  Patient Location: PACU  Anesthesia Type: MAC  Level of Consciousness: awake, alert  and patient cooperative  Airway and Oxygen Therapy: Patient Spontanous Breathing and Patient connected to supplemental oxygen  Post-op Assessment: Post-op Vital signs reviewed, Patient's Cardiovascular Status Stable, Respiratory Function Stable, Patent Airway and No signs of Nausea or vomiting  Post-op Vital Signs: Reviewed and stable  Complications: No notable events documented.

## 2021-07-07 NOTE — Anesthesia Preprocedure Evaluation (Signed)
Anesthesia Evaluation  Patient identified by MRN, date of birth, ID band Patient awake    Reviewed: Allergy & Precautions, H&P , NPO status , Patient's Chart, lab work & pertinent test results, reviewed documented beta blocker date and time   Airway Mallampati: II  TM Distance: >3 FB Neck ROM: full    Dental no notable dental hx.    Pulmonary former smoker,    Pulmonary exam normal breath sounds clear to auscultation       Cardiovascular Exercise Tolerance: Good hypertension, Normal cardiovascular exam Rhythm:regular Rate:Normal     Neuro/Psych negative neurological ROS  negative psych ROS   GI/Hepatic negative GI ROS, Neg liver ROS,   Endo/Other  Primary hyperparathyroidism  Renal/GU negative Renal ROS  negative genitourinary   Musculoskeletal   Abdominal   Peds  Hematology negative hematology ROS (+)   Anesthesia Other Findings   Reproductive/Obstetrics negative OB ROS                             Anesthesia Physical  Anesthesia Plan  ASA: 2  Anesthesia Plan: MAC   Post-op Pain Management: Minimal or no pain anticipated   Induction:   PONV Risk Score and Plan: 2 and Treatment may vary due to age or medical condition, TIVA and Midazolam  Airway Management Planned:   Additional Equipment:   Intra-op Plan:   Post-operative Plan:   Informed Consent: I have reviewed the patients History and Physical, chart, labs and discussed the procedure including the risks, benefits and alternatives for the proposed anesthesia with the patient or authorized representative who has indicated his/her understanding and acceptance.     Dental Advisory Given  Plan Discussed with: CRNA  Anesthesia Plan Comments:         Anesthesia Quick Evaluation

## 2021-07-07 NOTE — Anesthesia Postprocedure Evaluation (Signed)
Anesthesia Post Note  Patient: Vanessa Cameron  Procedure(s) Performed: CATARACT EXTRACTION PHACO AND INTRAOCULAR LENS PLACEMENT (IOC) LEFT (Left: Eye)     Patient location during evaluation: PACU Anesthesia Type: MAC Level of consciousness: awake and alert and oriented Pain management: satisfactory to patient Vital Signs Assessment: post-procedure vital signs reviewed and stable Respiratory status: spontaneous breathing, nonlabored ventilation and respiratory function stable Cardiovascular status: blood pressure returned to baseline and stable Postop Assessment: Adequate PO intake and No signs of nausea or vomiting Anesthetic complications: no   No notable events documented.  Raliegh Ip

## 2021-07-07 NOTE — Op Note (Signed)
OPERATIVE NOTE  Vanessa Cameron [id number] 07/07/2021   PREOPERATIVE DIAGNOSIS:  Nuclear sclerotic cataract left eye.  H25.12   POSTOPERATIVE DIAGNOSIS:    Nuclear sclerotic cataract left eye.     PROCEDURE:  Phacoemusification with posterior chamber intraocular lens placement of the left eye   LENS:   Implant Name Type Inv. Item Serial No. Manufacturer Lot No. LRB No. Used Action  LENS IOL TECNIS EYHANCE 22.0 - [id number] Intraocular Lens LENS IOL TECNIS EYHANCE 22.0 [id number] [employer]  Left 1 Implanted      Procedure(s) with comments: CATARACT EXTRACTION PHACO AND INTRAOCULAR LENS PLACEMENT (IOC) LEFT (Left) - 9.92 00:52.3  DIB00 +22.0   ULTRASOUND TIME: 0 minutes 52 seconds.  CDE 9.92   SURGEON:  Willey Blade, MD, MPH   ANESTHESIA:  Topical with tetracaine drops augmented with 1% preservative-free intracameral lidocaine.  ESTIMATED BLOOD LOSS: <1 mL   COMPLICATIONS:  None.   DESCRIPTION OF PROCEDURE:  The patient was identified in the holding room and transported to the operating room and placed in the supine position under the operating microscope.  The left eye was identified as the operative eye and it was prepped and draped in the usual sterile ophthalmic fashion.   A 1.0 millimeter clear-corneal paracentesis was made at the 5:00 position. 0.5 ml of preservative-free 1% lidocaine with epinephrine was injected into the anterior chamber.  The anterior chamber was filled with Healon 5 viscoelastic.  A 2.4 millimeter keratome was used to make a near-clear corneal incision at the 2:00 position.  A curvilinear capsulorrhexis was made with a cystotome and capsulorrhexis forceps.  Balanced salt solution was used to hydrodissect and hydrodelineate the nucleus.   Phacoemulsification was then used in stop and chop fashion to remove the lens nucleus and epinucleus.  The remaining cortex was then removed using the irrigation and aspiration handpiece. Healon was then placed into the  capsular bag to distend it for lens placement.  A lens was then injected into the capsular bag.  The remaining viscoelastic was aspirated.   Wounds were hydrated with balanced salt solution.  The anterior chamber was inflated to a physiologic pressure with balanced salt solution.  Intracameral vigamox 0.1 mL undiltued was injected into the eye and a drop placed onto the ocular surface.  No wound leaks were noted.  The patient was taken to the recovery room in stable condition without complications of anesthesia or surgery  Willey Blade 07/07/2021, 11:54 AM

## 2021-07-07 NOTE — H&P (Signed)
[facility]   Primary Care Physician:  Marina Goodell, MD Ophthalmologist: Dr. Willey Blade  Pre-Procedure History & Physical: HPI:  Vanessa Cameron is a 81 y.o. female here for cataract surgery.   Past Medical History:  Diagnosis Date   Anemia    iron deficiency   Arthritis    Arthritis    Heart murmur    nothing done for this   History of kidney stones    had once when parathyroid gland was malfunctioning   History of stomach ulcers    Hypertension    Thyroid disease     Past Surgical History:  Procedure Laterality Date   ABDOMINAL HYSTERECTOMY     ABDOMINAL HYSTERECTOMY     CATARACT EXTRACTION W/PHACO Right 06/23/2021   Procedure: CATARACT EXTRACTION PHACO AND INTRAOCULAR LENS PLACEMENT (IOC) RIGHT;  Surgeon: Nevada Crane, MD;  Location: [facility];  Service: Ophthalmology;  Laterality: Right;  10.26 00:52.3   PARATHYROIDECTOMY  2011   THYROIDECTOMY     denies   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 Authorizing Provider  estradiol (ESTRACE) 0.1 MG/GM vaginal cream Estrogen Cream Instruction Discard applicator Apply pea sized amount to tip of finger to urethra before bed. Wash hands well after application. Use Monday, Wednesday and Friday 02/20/21  Yes McGowan, Carollee Herter A, PA-C  lisinopril (PRINIVIL,ZESTRIL) 10 MG tablet Take 10 mg by mouth daily.  11/12/17  Yes [provider]  traMADol (ULTRAM) 50 MG tablet Take 0.5-1 tablets by mouth 2 (two) times daily as needed. 1/2 - 1 tablet as needed 10/31/19  Yes [provider]  benzonatate (TESSALON) 100 MG capsule Take 1 capsule (100 mg total) by mouth every 8 (eight) hours. Patient not taking: Reported on 06/05/2021 05/18/21   Raspet, Noberto Retort, PA-C  doxycycline (VIBRAMYCIN) 100 MG  capsule Take 1 capsule (100 mg total) by mouth 2 (two) times daily. Patient not taking: Reported on 06/05/2021 05/18/21   Raspet, Denny Peon K, PA-C  ferrous sulfate 325 (65 FE) MG tablet Take 325 mg by mouth daily.  Patient not taking: Reported on 06/05/2021    [provider]    Allergies as of 04/16/2021 - Review Complete 02/24/2021  Allergen Reaction Noted   Penicillins Shortness Of Breath, Itching, Swelling, and Other (See Comments) 01/05/2014   Nsaids Other (See Comments) 01/05/2014    History reviewed. No pertinent family history.  Social History   Socioeconomic History   Marital status: Widowed    Spouse name: Not on file   Number of children: Not on file   Years of education: Not on file   Highest education level: Not on file  Occupational History   Occupation: ct scan operator    Comment: retired  Tobacco Use   Smoking status: Former    Packs/day: 0.50    Types: Cigarettes    Quit date: 06/01/1998    Years since quitting: 23.1   Smokeless tobacco: Never   Tobacco comments:    quit 30 yrs ago  Vaping Use   Vaping Use: Never used  Substance and Sexual Activity   Alcohol use: Yes    Alcohol/week: 1.0 standard drink    Types: 1 Glasses of wine per week   Drug use: Never   Sexual activity: Not on file  Other Topics Concern   Not on file  Social History Narrative   Not on file   Social Determinants of Health   Financial Resource Strain: Not on file  Food Insecurity: Not on file  Transportation Needs: Not on file  Physical Activity: Not on file  Stress: Not on file  Social Connections: Not on file  Intimate Partner Violence: Not on file    Review of Systems: See HPI, otherwise negative ROS  Physical Exam: BP (!) 150/87    Pulse 77    Temp 97.9 F (36.6 C)    Ht 5\' 4"  (1.626 m)    Wt 58.5 kg    SpO2 100%    BMI 22.14 kg/m  General:   Alert, cooperative in NAD Head:  Normocephalic and atraumatic. Respiratory:  Normal work of breathing. Cardiovascular:   RRR  Impression/Plan: Vanessa Cameron is here for cataract surgery.  Risks, benefits, limitations, and alternatives regarding cataract surgery have been reviewed with the patient.  Questions have been answered.  All parties agreeable.   Cira Rue, MD  07/07/2021, 11:25 AM

## 2021-07-08 ENCOUNTER — Encounter: Payer: Self-pay | Admitting: Ophthalmology

## 2022-01-19 ENCOUNTER — Other Ambulatory Visit
Admission: RE | Admit: 2022-01-19 | Discharge: 2022-01-19 | Disposition: A | Discharge status: Home / Self Care | Payer: Medicare Other | Attending: Urology | Admitting: Urology

## 2022-01-19 ENCOUNTER — Other Ambulatory Visit: Payer: Self-pay

## 2022-01-19 ENCOUNTER — Ambulatory Visit (INDEPENDENT_AMBULATORY_CARE_PROVIDER_SITE_OTHER): Payer: Medicare Other | Admitting: Urology

## 2022-01-19 ENCOUNTER — Encounter: Payer: Self-pay | Admitting: Urology

## 2022-01-19 VITALS — BP 179/82 | HR 70 | Ht 64.0 in | Wt 131.0 lb

## 2022-01-19 DIAGNOSIS — N390 Urinary tract infection, site not specified: Secondary | ICD-10-CM

## 2022-01-19 DIAGNOSIS — N952 Postmenopausal atrophic vaginitis: Secondary | ICD-10-CM | POA: Diagnosis not present

## 2022-01-19 DIAGNOSIS — Z87440 Personal history of urinary (tract) infections: Secondary | ICD-10-CM

## 2022-01-19 LAB — URINALYSIS, COMPLETE (UACMP) WITH MICROSCOPIC
Bacteria, UA: NONE SEEN
Bilirubin Urine: NEGATIVE
Glucose, UA: NEGATIVE mg/dL
Ketones, ur: NEGATIVE mg/dL
Leukocytes,Ua: NEGATIVE
Nitrite: NEGATIVE
Protein, ur: NEGATIVE mg/dL
RBC / HPF: NONE SEEN RBC/hpf (ref 0–5)
Specific Gravity, Urine: 1.01 (ref 1.005–1.030)
Squamous Epithelial / HPF: NONE SEEN (ref 0–5)
WBC, UA: NONE SEEN WBC/hpf (ref 0–5)
pH: 7 (ref 5.0–8.0)

## 2022-01-19 MED ORDER — ESTRADIOL 0.1 MG/GM VA CREA: TOPICAL_CREAM | VAGINAL | 11 refills | Status: DC

## 2022-01-19 NOTE — Progress Notes (Signed)
01/19/22 9:17 AM   Vanessa Cameron February 19, 1941 [id number]  Referring provider:  Sofie Hartigan, MD [place],  [place] [id number]  Chief Complaint  Patient presents with   Recurrent UTI    1 year follow up   Urological history  1. 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 Vaginal atrophy -manage with vaginal estrogen cream three nights weekly   HPI: Vanessa Cameron is a 81 y.o.female who presents today for a 1 year follow-up with UA.   She is doing well today and has no other urological concerns. She denies any recent infections.   Patient denies any modifying or aggravating factors.  Patient denies any gross hematuria, dysuria or suprapubic/flank pain.  Patient denies any fevers, chills, nausea or vomiting.   UA clear.   PMH: Past Medical History:  Diagnosis Date   Anemia    iron deficiency   Arthritis    Arthritis    Heart murmur    nothing done for this   History of kidney stones    had once when parathyroid gland was malfunctioning   History of stomach ulcers    Hypertension    Thyroid disease     Surgical History: Past Surgical History:  Procedure Laterality Date   ABDOMINAL HYSTERECTOMY     ABDOMINAL HYSTERECTOMY     CATARACT EXTRACTION W/PHACO Right 06/23/2021   Procedure: CATARACT EXTRACTION PHACO AND INTRAOCULAR LENS PLACEMENT ([place]) RIGHT;  Surgeon: Eulogio Bear, MD;  Location: [place];  Service: Ophthalmology;  Laterality: Right;  10.26 00:52.3   CATARACT EXTRACTION W/PHACO Left 07/07/2021   Procedure: CATARACT EXTRACTION PHACO AND INTRAOCULAR LENS PLACEMENT (IOC) LEFT;  Surgeon: Eulogio Bear, MD;  Location: [place];  Service: Ophthalmology;  Laterality: Left;  9.92 00:52.3   PARATHYROIDECTOMY  2011   THYROIDECTOMY     denies   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 Yes Sudden or severe rash/hives, skin peeling, or the inside of the mouth or nose? Yes Did it require medical treatment? Yes When did it last happen? 81 years old If all above answers are "NO", may proceed with cephalosporin use.   Nsaids Other (See Comments)   Had a bleeding ulcer . Corroded stomach lining at site of artery        Medication List        Accurate as of January 19, 2022  9:17 AM. If you have any questions, ask your nurse or doctor.          STOP taking these medications    benzonatate 100 MG capsule Commonly known as: TESSALON Stopped by: Lizandro Spellman, PA-C   doxycycline 100 MG capsule Commonly known as: VIBRAMYCIN Stopped by: Zara Council, PA-C       TAKE these medications    estradiol 0.1 MG/GM vaginal cream Commonly known as: ESTRACE Estrogen Cream Instruction Discard applicator Apply pea sized amount to tip of finger to  urethra before bed. Wash hands well after application. Use Monday, Wednesday and Friday   ferrous sulfate 325 (65 FE) MG tablet Take 325 mg by mouth daily.   lisinopril 10 MG tablet Commonly known as: ZESTRIL Take 10 mg by mouth daily.   traMADol 50 MG tablet Commonly known as: ULTRAM Take 0.5-1 tablets by mouth 2 (two) times daily as needed. 1/2 - 1 tablet as needed        Allergies:  Allergies  Allergen Reactions   Penicillins Shortness Of Breath, Itching, Swelling and Other (See Comments)    TONGUE SWELLING DID THE REACTION INVOLVE: Swelling of the face/tongue/throat, SOB, or low BP? Yes Sudden or severe rash/hives, skin peeling, or the inside of  the mouth or nose? Yes Did it require medical treatment? Yes When did it last happen? 81 years old If all above answers are "NO", may proceed with cephalosporin use.    Nsaids Other (See Comments)    Had a bleeding ulcer . Corroded stomach lining at site of artery    Family History: No family history on file.  Social History:  reports that she quit smoking about 23 years ago. Her smoking use included cigarettes. She smoked an average of .5 packs per day. She has never used smokeless tobacco. She reports current alcohol use of about 1.0 standard drink of alcohol per week. She reports that she does not use drugs.   Physical Exam: BP (!) 179/82   Pulse 70   Ht 5\' 4"  (1.626 m)   Wt 131 lb (59.4 kg)   BMI 22.49 kg/m   Constitutional:  Alert and oriented, No acute distress. HEENT: [place] AT, moist mucus membranes.  Trachea midline Cardiovascular: No clubbing, cyanosis, or edema. Respiratory: Normal respiratory effort, no increased work of breathing. Neurologic: Grossly intact, no focal deficits, moving all 4 extremities. Psychiatric: Normal mood and affect.  Laboratory Data:  Ref Range & Units 8 mo ago  Glucose 70 - 110 mg/dL 84   Sodium - 505 mmol/L 135 Low    Potassium 3.6 - 5.1 mmol/L 4.3   Chloride 97 - 109 mmol/L 100   Carbon Dioxide (CO2) 22.0 - 32.0 mmol/L 30.4   Urea Nitrogen (BUN) 7 - 25 mg/dL 11   Creatinine 0.6 - 1.1 mg/dL 0.5 Low    Glomerular Filtration Rate (eGFR), MDRD Estimate >60 mL/min/1.73sq m 119   Calcium 8.7 - 10.3 mg/dL 9.5   AST  8 - 39 U/L 22   ALT  5 - 38 U/L 16   Alk Phos (alkaline Phosphatase) 34 - 104 U/L 77   Albumin 3.5 - 4.8 g/dL 4.4   Bilirubin, Total 0.3 - 1.2 mg/dL 1.5 High    Protein, Total 6.1 - 7.9 g/dL 6.5   A/G Ratio 1.0 - 5.0 gm/dL 2.1   Resulting Agency  [facility]   Specimen Collected: 05/02/21 08:28 Last Resulted: 05/02/21 14:57  [facility]  Result Received: 05/18/21 08:12     Ref Range & Units 8 mo ago  WBC (White Blood Cell Count) 4.1 - 10.2 10^3/uL 4.3   RBC (Red Blood Cell Count) 4.04 - 5.48 10^6/uL 4.14   Hemoglobin 12.0 - 15.0 gm/dL 05/20/21   Hematocrit 67.3 - 47.0 % 40.4   MCV (Mean Corpuscular Volume) 80.0 - 100.0 fl 97.6   MCH (Mean Corpuscular Hemoglobin) 27.0 - 31.2 pg 33.8 High    MCHC (Mean Corpuscular Hemoglobin Concentration) 32.0 - 36.0 gm/dL 34.7  Platelet Count 150 - 450 10^3/uL 250   RDW-CV (Red Cell Distribution Width) 11.6 - 14.8 % 12.4   MPV (Mean Platelet Volume) 9.4 - 12.4 fl 8.8 Low    Neutrophils 1.50 - 7.80 10^3/uL 2.70   Lymphocytes 1.00 - 3.60 10^3/uL 1.10   Mixed Count 0.10 - 0.90 10^3/uL 0.50   Neutrophil % 32.0 - 70.0 % 64.2   Lymphocyte % 10.0 - 50.0 % 24.6   Mixed % 3.0 - 14.4 % 11.2   Resulting Agency  [facility] - [facility]   Specimen Collected: 05/02/21 08:28 Last Resulted: 05/02/21 10:05  Received From: [place]  Result Received: 05/18/21 08:12   Urinalysis Results for orders placed or performed during the hospital encounter of 01/19/22  Urinalysis, Complete w Microscopic  Result Value Ref Range   Color, Urine YELLOW YELLOW   APPearance CLEAR CLEAR   Specific Gravity, Urine 1.010 1.005 - 1.030   pH 7.0 5.0 - 8.0   Glucose, UA NEGATIVE NEGATIVE mg/dL   Hgb urine dipstick TRACE (A) NEGATIVE   Bilirubin Urine NEGATIVE NEGATIVE   Ketones, ur NEGATIVE NEGATIVE mg/dL   Protein, ur NEGATIVE NEGATIVE mg/dL   Nitrite NEGATIVE NEGATIVE   Leukocytes,Ua NEGATIVE NEGATIVE   Squamous Epithelial / LPF NONE SEEN 0 - 5   WBC, UA NONE SEEN 0 - 5 WBC/hpf   RBC / HPF NONE SEEN 0 - 5 RBC/hpf   Bacteria, UA NONE SEEN NONE SEEN  I have reviewed the labs.   Pertinent Imaging N/A  Assessment & Plan:    Vaginal atrophy  - Continue vaginal estrogen cream x3 night weekly; refill sent today.  rUTIs - Managing with cranberry tablets, yogeurt, and etrogen cream and has been infection free for a year now    UA unremarkable  Return in about 1 year (around 01/20/2023) for UA and OAB questionnaire.  [facility] [address] [place], [place] [address] [phone]  I, Kirke Shaggy Littlejohn,acting as a scribe for [facility], PA-C.,have documented all relevant documentation on the behalf of Reginae Wolfrey, PA-C,as directed by  [facility], PA-C while in the presence of [place], PA-C.  I have reviewed the above documentation for accuracy and completeness, and I agree with the above.    Zara Council, PA-C

## 2023-01-22 NOTE — Progress Notes (Unsigned)
01/25/23 9:08 AM   Vanessa Cameron 03-15-1941 [id number]  Referring provider:  Marina Goodell, MD [address] [place],  [place] [address]  Urological history  1. rUTI's -contributing factors of age, vaginal atrophy,  -RUS 2019 NED -documented urine cultures over the last year   None  2. GSM -vaginal estrogen cream three nights weekly  Chief Complaint  Patient presents with   Recurrent UTI    HPI: Vanessa Cameron is a 82 y.o.female who presents today for a 1 year follow-up.  Previous records reviewed.    She is having 1-7 daytime voids, nocturia x 1-2 with a mild urge to urinate.  She wears a panty liner daily.  This is more for security than incontience episodes.  Patient denies any modifying or aggravating factors.  Patient denies any recent UTI's, gross hematuria, dysuria or suprapubic/flank pain.  Patient denies any fevers, chills, nausea or vomiting.   PMH: Past Medical History:  Diagnosis Date   Anemia    iron deficiency   Arthritis    Arthritis    Heart murmur    nothing done for this   History of kidney stones    had once when parathyroid gland was malfunctioning   History of stomach ulcers    Hypertension    Thyroid disease     Surgical History: Past Surgical History:  Procedure Laterality Date   ABDOMINAL HYSTERECTOMY     ABDOMINAL HYSTERECTOMY     CATARACT EXTRACTION W/PHACO Right 06/23/2021   Procedure: CATARACT EXTRACTION PHACO AND INTRAOCULAR LENS PLACEMENT (IOC) RIGHT;  Surgeon: Nevada Crane, MD;  Location: [facility];  Service: Ophthalmology;  Laterality: Right;  10.26 00:52.3   CATARACT EXTRACTION W/PHACO Left 07/07/2021   Procedure: CATARACT EXTRACTION PHACO AND INTRAOCULAR LENS PLACEMENT (IOC) LEFT;  Surgeon: Nevada Crane, MD;  Location: [facility];  Service: Ophthalmology;  Laterality: Left;  9.92 00:52.3   PARATHYROIDECTOMY  2011   THYROIDECTOMY     denies   TOTAL HIP ARTHROPLASTY 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 Yes Sudden or severe rash/hives, skin peeling, or the inside of the mouth or nose? Yes Did it require medical treatment? Yes When did it last happen? 82 years old If all above answers are "NO", may proceed with cephalosporin use.   Nsaids Other (See Comments)   Had a bleeding ulcer . Corroded stomach lining at site of artery        Medication List        Accurate as of January 25, 2023  9:08 AM. If you have any questions, ask your nurse or doctor.          STOP taking these medications    ferrous sulfate 325 (65 FE) MG tablet Stopped by: Rimsha Trembley       TAKE these medications    CRANBERRY PO Take by mouth daily.   estradiol 0.1 MG/GM vaginal cream Commonly known as: ESTRACE Estrogen Cream Instruction Discard applicator Apply pea sized amount to tip of finger to urethra before bed. Wash hands well after application. Use Monday, Wednesday and Friday   lisinopril 10 MG tablet  Commonly known as: ZESTRIL Take 10 mg by mouth daily.   PRESERVISION AREDS PO Take by mouth. AREDS   traMADol 50 MG tablet Commonly known as: ULTRAM Take 0.5-1 tablets by mouth 2 (two) times daily as needed. 1/2 - 1 tablet as needed        Allergies:  Allergies  Allergen Reactions   Penicillins Shortness Of Breath, Itching, Swelling and Other (See Comments)    TONGUE SWELLING DID THE REACTION INVOLVE: Swelling of the face/tongue/throat, SOB, or low BP? Yes Sudden or severe rash/hives, skin peeling, or the inside of the mouth or nose? Yes Did it require medical treatment? Yes When did it last happen? 82 years old If all above  answers are "NO", may proceed with cephalosporin use.    Nsaids Other (See Comments)    Had a bleeding ulcer . Corroded stomach lining at site of artery    Family History: No family history on file.  Social History:  reports that she quit smoking about 24 years ago. Her smoking use included cigarettes. She has never used smokeless tobacco. She reports current alcohol use of about 1.0 standard drink of alcohol per week. She reports that she does not use drugs.   Physical Exam: BP (!) 178/79 (BP Location: Left Arm, Patient Position: Sitting, Cuff Size: Normal)   Pulse 73   Ht 5\' 3"  (1.6 m)   Wt 133 lb (60.3 kg)   BMI 23.56 kg/m   Constitutional:  Well nourished. Alert and oriented, No acute distress. HEENT: [place] AT, moist mucus membranes.  Trachea midline Cardiovascular: No clubbing, cyanosis, or edema. Respiratory: Normal respiratory effort, no increased work of breathing. Neurologic: Grossly intact, no focal deficits, moving all 4 extremities. Psychiatric: Normal mood and affect.    Laboratory Data: N/A  Pertinent Imaging N/A  Assessment & Plan:    1. GSM -Continue vaginal estrogen cream 3 nights weekly  Return in about 1 year (around 01/25/2024) for OAB .  Cloretta Ned   [facility] [address], [address] [place], [place] [address] [phone]

## 2023-01-25 ENCOUNTER — Encounter: Payer: Self-pay | Admitting: Urology

## 2023-01-25 ENCOUNTER — Ambulatory Visit (INDEPENDENT_AMBULATORY_CARE_PROVIDER_SITE_OTHER): Payer: Medicare Other | Admitting: Urology

## 2023-01-25 VITALS — BP 178/79 | HR 73 | Ht 63.0 in | Wt 133.0 lb

## 2023-01-25 DIAGNOSIS — N952 Postmenopausal atrophic vaginitis: Secondary | ICD-10-CM | POA: Diagnosis not present

## 2023-01-25 MED ORDER — ESTRADIOL 0.1 MG/GM VA CREA: TOPICAL_CREAM | VAGINAL | 11 refills | Status: DC

## 2023-01-25 NOTE — Addendum Note (Signed)
Addended by: Consuella Lose on: 01/25/2023 10:32 AM   Modules accepted: Orders

## 2023-12-28 ENCOUNTER — Emergency Department
Admission: EM | Admit: 2023-12-28 | Discharge: 2023-12-28 | Disposition: A | Discharge status: Home / Self Care | Attending: Emergency Medicine | Admitting: Emergency Medicine

## 2023-12-28 DIAGNOSIS — K625 Hemorrhage of anus and rectum: Secondary | ICD-10-CM | POA: Diagnosis present

## 2023-12-28 DIAGNOSIS — R42 Dizziness and giddiness: Secondary | ICD-10-CM | POA: Insufficient documentation

## 2023-12-28 DIAGNOSIS — K648 Other hemorrhoids: Secondary | ICD-10-CM | POA: Insufficient documentation

## 2023-12-28 LAB — TYPE AND SCREEN
ABO/RH(D): O POS
Antibody Screen: NEGATIVE

## 2023-12-28 LAB — COMPREHENSIVE METABOLIC PANEL WITH GFR
ALT: 18 U/L (ref 0–44)
AST: 24 U/L (ref 15–41)
Albumin: 3.9 g/dL (ref 3.5–5.0)
Alkaline Phosphatase: 74 U/L (ref 38–126)
Anion gap: 12 (ref 5–15)
BUN: 13 mg/dL (ref 8–23)
CO2: 23 mmol/L (ref 22–32)
Calcium: 9.3 mg/dL (ref 8.9–10.3)
Chloride: 99 mmol/L (ref 98–111)
Creatinine, Ser: 0.47 mg/dL (ref 0.44–1.00)
GFR, Estimated: >60 mL/min (ref >60)
Glucose, Bld: 93 mg/dL (ref 70–99)
Potassium: 4 mmol/L (ref 3.5–5.1)
Sodium: 134 mmol/L — ABNORMAL LOW (ref 135–145)
Total Bilirubin: 1.2 mg/dL (ref 0.0–1.2)
Total Protein: 5.8 g/dL — ABNORMAL LOW (ref 6.5–8.1)

## 2023-12-28 LAB — CBC
HCT: 35.3 % — ABNORMAL LOW (ref 36.0–46.0)
Hemoglobin: 12.3 g/dL (ref 12.0–15.0)
MCH: 34 pg (ref 26.0–34.0)
MCHC: 34.8 g/dL (ref 30.0–36.0)
MCV: 97.5 fL (ref 80.0–100.0)
Platelets: 277 K/uL (ref 150–400)
RBC: 3.62 MIL/uL — ABNORMAL LOW (ref 3.87–5.11)
RDW: 12 % (ref 11.5–15.5)
WBC: 4.6 K/uL (ref 4.0–10.5)
nRBC: 0 % (ref 0.0–0.2)

## 2023-12-28 NOTE — Discharge Instructions (Addendum)
 As we discussed, please pick up the hemorrhoid cream that includes the active ingredient phenylephrine  and apply this every time you go to the bathroom or pass a bowel movement.  This should help shrink blood vessels and decrease bleeding.  Reach out to the GI doctor to be seen in the clinic to discuss a colonoscopy  If your symptoms worsen in the meantime then please return to the ED

## 2023-12-28 NOTE — ED Provider Notes (Signed)
 [facility] Provider Note    Event Date/Time   First MD Initiated Contact with Patient 12/28/23 [phone]     (approximate)   History   Rectal Bleeding and Dizziness   HPI  Vanessa Cameron is a 83 y.o. female who presents to the ED for evaluation of Rectal Bleeding and Dizziness   Patient with a history of HTN but no anticoagulation.  Patient presents to the ED from home via POV alongside a friend for evaluation of rectal bleeding alongside her stools over the past couple days.  No pain, to the anterior abdomen or to anorectal area, she reports normal formed stool over the past few days.  Alongside this though she has bright red blood per rectum, only while passing stool.  No other bleeding such as rectal bleeding outside of passing stool, vaginal bleeding, bleeding gums or nosebleeds.  She saw her PCP yesterday did not say anything  She reports an episode of dizziness without syncope or falls.   Physical Exam   Triage Vital Signs: ED Triage Vitals [12/28/23 0843]  Encounter Vitals Group     BP (!) 181/81     Girls Systolic BP Percentile      Girls Diastolic BP Percentile      Boys Systolic BP Percentile      Boys Diastolic BP Percentile      Pulse Rate 86     Resp 18     Temp 98.3 F (36.8 C)     Temp Source Oral     SpO2 100 %     Weight 133 lb (60.3 kg)     Height 5' 3 (1.6 m)     Head Circumference      Peak Flow      Pain Score 0     Pain Loc      Pain Education      Exclude from Growth Chart     Most recent vital signs: Vitals:   12/28/23 0843 12/28/23 1000  BP: (!) 181/81 (!) 155/75  Pulse: 86 79  Resp: 18 16  Temp: 98.3 F (36.8 C)   SpO2: 100% 100%    General: Awake, no distress.  CV:  Good peripheral perfusion.  Resp:  Normal effort.  Abd:  No distention.  Soft and benign without tenderness, guarding or peritoneal features MSK:  No deformity noted.  Neuro:  No focal deficits appreciated. Other:     ED Results /  Procedures / Treatments   Labs (all labs ordered are listed, but only abnormal results are displayed) Labs Reviewed  COMPREHENSIVE METABOLIC PANEL WITH GFR - Abnormal; Notable for the following components:      Result Value   Sodium 134 (*)    Total Protein 5.8 (*)    All other components within normal limits  CBC - Abnormal; Notable for the following components:   RBC 3.62 (*)    HCT 35.3 (*)    All other components within normal limits  POC OCCULT BLOOD, ED  TYPE AND SCREEN    EKG Sinus rhythm with a rate of 74 bpm.  Normal axis and intervals without for signs of acute ischemia.  RADIOLOGY   Official radiology report(s): No results found.  PROCEDURES and INTERVENTIONS:  Procedures  Medications - No data to display   IMPRESSION / MDM / ASSESSMENT AND PLAN / ED COURSE  I reviewed the triage vital signs and the nursing notes.  Differential diagnosis includes, but is not limited to,  internal hemorrhoids, diverticular bleeding, or brisk upper GI bleed, blood loss anemia, symptomatic anemia, cardiac dysrhythmia  {Patient presents with symptoms of an acute illness or injury that is potentially life-threatening.  Patient presents with couple days of rectal bleeding, likely internal hemorrhoids, and suitable for outpatient management with GI follow-up.  She looks well to me with stable vital signs and asymptomatic.  Normal exam.  Normal hemoglobin and metabolic panel.  No evidence of dysrhythmias or persistent dizziness.  No ongoing bleeding and with a stable hemoglobin I discuss options with her and she is comfortable going home to follow-up with GI.  We discussed expectant management OTC medications and close ED return precautions.      FINAL CLINICAL IMPRESSION(S) / ED DIAGNOSES   Final diagnoses:  Rectal bleeding  Internal hemorrhoid     Rx / DC Orders   ED Discharge Orders     None        Note:  This document was prepared using Dragon voice recognition  software and may include unintentional dictation errors.   Claudene Rover, MD 12/28/23 1028

## 2023-12-28 NOTE — ED Triage Notes (Signed)
 Pt c/o dizziness and bright red rectal bleeding x2 days.  Denies pain.  Pt reports bleeding only w/ BMs.  Hx of bleeding ulcer and internal hemorrhoids.

## 2024-02-06 NOTE — Progress Notes (Unsigned)
 02/07/24 9:13 AM   Vanessa Cameron 1940-06-03 [id number]  Referring provider:  Jeffie Cheryl BRAVO, MD [address] [place],  [place] [address]  Urological history  1. rUTI's -contributing factors of age, vaginal atrophy,  -RUS (2019) NED -documented urine cultures over the last year   None  2. GSM -vaginal estrogen cream three nights weekly  Chief Complaint  Patient presents with   Follow-up    Vaginal atrophy    HPI: Vanessa Cameron is a 83 y.o.female who presents today for a 1 year follow-up.  Previous records reviewed.    She is experiencing 1-7 daytime voids, nocturia x 1-2, 0 incontinent episodes daily, she is wearing one pantyliner daily for security.   She engages in toilet mapping,  She does not engage in fluid restrictions in an effort to control urinary symptoms.    Patient denies any modifying or aggravating factors.  Patient denies any recent UTI's, gross hematuria, dysuria or suprapubic/flank pain.  Patient denies any fevers, chills, nausea or vomiting.    Serum creatinine  (11/2023) 0.47,eGFR >60  PMH: Past Medical History:  Diagnosis Date   Anemia    iron deficiency   Arthritis    Arthritis    Heart murmur    nothing done for this   History of kidney stones    had once when parathyroid gland was malfunctioning   History of stomach ulcers    Hypertension    Thyroid disease     Surgical History: Past Surgical History:  Procedure Laterality Date   ABDOMINAL HYSTERECTOMY     ABDOMINAL HYSTERECTOMY     CATARACT EXTRACTION W/PHACO Right 06/23/2021   Procedure: CATARACT EXTRACTION PHACO AND INTRAOCULAR LENS PLACEMENT (IOC) RIGHT;  Surgeon: Myrna Adine Anes, MD;  Location: [facility];  Service: Ophthalmology;  Laterality: Right;  10.26 00:52.3   CATARACT EXTRACTION W/PHACO Left 07/07/2021   Procedure: CATARACT EXTRACTION PHACO AND INTRAOCULAR LENS PLACEMENT (IOC) LEFT;  Surgeon: Myrna Adine Anes, MD;  Location: [facility];   Service: Ophthalmology;  Laterality: Left;  9.92 00:52.3   PARATHYROIDECTOMY  2011   THYROIDECTOMY     denies   TOTAL HIP ARTHROPLASTY Right 06/30/2018   Procedure: TOTAL HIP ARTHROPLASTY;  Surgeon: Edie Norleen PARAS, MD;  Location: [facility];  Service: Orthopedics;  Laterality: Right;    Home Medications:  Allergies as of 02/07/2024       Reactions   Penicillins Shortness Of Breath, Itching, Swelling, Other (See Comments)   TONGUE SWELLING DID THE REACTION INVOLVE: Swelling of the face/tongue/throat, SOB, or low BP? Yes Sudden or severe rash/hives, skin peeling, or the inside of the mouth or nose? Yes Did it require medical treatment? Yes When did it last happen? 83 years old If all above answers are "NO", may proceed with cephalosporin use.   Nsaids Other (See Comments)   Had a bleeding ulcer . Corroded stomach lining at site of artery        Medication List        Accurate as of February 07, 2024  9:13 AM. If you have any questions, ask your nurse or doctor.          CRANBERRY PO Take by mouth daily.   estradiol  0.1 MG/GM vaginal cream Commonly known as: ESTRACE  Estrogen Cream Instruction Discard applicator Apply pea sized amount to tip of finger to urethra before bed. Wash hands well after application. Use Monday, Wednesday and Friday   lisinopril  10 MG  tablet Commonly known as: ZESTRIL  Take 10 mg by mouth daily.   PRESERVISION AREDS PO Take by mouth. AREDS   traMADol  50 MG tablet Commonly known as: ULTRAM  Take 0.5-1 tablets by mouth 2 (two) times daily as needed. 1/2 - 1 tablet as needed        Allergies:  Allergies  Allergen Reactions   Penicillins Shortness Of Breath, Itching, Swelling and Other (See Comments)    TONGUE SWELLING DID THE REACTION INVOLVE: Swelling of the face/tongue/throat, SOB, or low BP? Yes Sudden or severe rash/hives, skin peeling, or the inside of the mouth or nose? Yes Did it require medical treatment? Yes When did it last  happen? 83 years old If all above answers are "NO", may proceed with cephalosporin use.    Nsaids Other (See Comments)    Had a bleeding ulcer . Corroded stomach lining at site of artery    Family History: No family history on file.  Social History:  reports that she quit smoking about 25 years ago. Her smoking use included cigarettes. She has never used smokeless tobacco. She reports current alcohol use of about 1.0 standard drink of alcohol per week. She reports that she does not use drugs.   Physical Exam: BP (!) 188/79 (BP Location: Left Arm, Patient Position: Sitting, Cuff Size: Normal)   Pulse 87   Wt 134 lb (60.8 kg)   SpO2 99%   BMI 23.74 kg/m   Constitutional:  Well nourished. Alert and oriented, No acute distress. HEENT: [place] AT, moist mucus membranes.  Trachea midline Cardiovascular: No clubbing, cyanosis, or edema. Respiratory: Normal respiratory effort, no increased work of breathing. Neurologic: Grossly intact, no focal deficits, moving all 4 extremities. Psychiatric: Normal mood and affect.    Laboratory Data: See HPI and  EPIC I have reviewed the labs.  See HPI.     Pertinent Imaging N/A  Assessment & Plan:    1. GSM - She is not experiencing any GSM symptoms -She has not had any UTIs -Continue vaginal estrogen cream 3 nights weekly  Return in about 1 year (around 02/06/2025) for Medication management.  CLOTILDA HELON RIGGERS   [facility] [address] [place], [place] [address] [phone]

## 2024-02-07 ENCOUNTER — Encounter: Payer: Self-pay | Admitting: Urology

## 2024-02-07 ENCOUNTER — Ambulatory Visit (INDEPENDENT_AMBULATORY_CARE_PROVIDER_SITE_OTHER): Payer: Self-pay | Admitting: Urology

## 2024-02-07 VITALS — BP 188/79 | HR 87 | Wt 134.0 lb

## 2024-02-07 DIAGNOSIS — N952 Postmenopausal atrophic vaginitis: Secondary | ICD-10-CM | POA: Diagnosis not present

## 2024-02-07 DIAGNOSIS — N958 Other specified menopausal and perimenopausal disorders: Secondary | ICD-10-CM | POA: Diagnosis not present

## 2024-02-07 MED ORDER — ESTRADIOL 0.1 MG/GM VA CREA: TOPICAL_CREAM | VAGINAL | 11 refills | Status: AC

## 2024-05-10 ENCOUNTER — Other Ambulatory Visit: Payer: Self-pay | Admitting: Family Medicine

## 2024-05-10 DIAGNOSIS — M5416 Radiculopathy, lumbar region: Secondary | ICD-10-CM

## 2024-05-17 ENCOUNTER — Ambulatory Visit: Admission: RE | Admit: 2024-05-17 | Discharge: 2024-05-17 | Attending: Family Medicine | Admitting: Family Medicine

## 2024-05-17 DIAGNOSIS — M5416 Radiculopathy, lumbar region: Secondary | ICD-10-CM | POA: Insufficient documentation

## 2025-02-12 ENCOUNTER — Ambulatory Visit: Admitting: Urology
